# Patient Record
Sex: Female | Born: 1970 | Race: Asian | Hispanic: No | Marital: Married | Smoking: Never smoker
Health system: Southern US, Community
[De-identification: ages and names within clinical notes are randomized; demographics above are authoritative.]

## PROBLEM LIST (undated history)

## (undated) DIAGNOSIS — R0602 Shortness of breath: Secondary | ICD-10-CM

## (undated) DIAGNOSIS — G43909 Migraine, unspecified, not intractable, without status migrainosus: Secondary | ICD-10-CM

## (undated) DIAGNOSIS — E039 Hypothyroidism, unspecified: Secondary | ICD-10-CM

## (undated) DIAGNOSIS — K589 Irritable bowel syndrome without diarrhea: Secondary | ICD-10-CM

## (undated) DIAGNOSIS — K219 Gastro-esophageal reflux disease without esophagitis: Secondary | ICD-10-CM

## (undated) DIAGNOSIS — E079 Disorder of thyroid, unspecified: Secondary | ICD-10-CM

## (undated) DIAGNOSIS — R002 Palpitations: Secondary | ICD-10-CM

## (undated) HISTORY — DX: Disorder of thyroid, unspecified: E07.9

## (undated) HISTORY — DX: Migraine, unspecified, not intractable, without status migrainosus: G43.909

## (undated) HISTORY — DX: Hypothyroidism, unspecified: E03.9

## (undated) HISTORY — PX: ANAL FISSURE REPAIR: SHX2312

## (undated) HISTORY — DX: Shortness of breath: R06.02

## (undated) HISTORY — DX: Palpitations: R00.2

## (undated) HISTORY — DX: Gastro-esophageal reflux disease without esophagitis: K21.9

## (undated) HISTORY — DX: Irritable bowel syndrome, unspecified: K58.9

---

## 2006-04-07 ENCOUNTER — Ambulatory Visit (HOSPITAL_COMMUNITY): Admission: RE | Admit: 2006-04-07 | Discharge: 2006-04-07 | Payer: Self-pay | Admitting: Gastroenterology

## 2006-04-09 ENCOUNTER — Ambulatory Visit (HOSPITAL_COMMUNITY): Admission: RE | Admit: 2006-04-09 | Discharge: 2006-04-09 | Payer: Self-pay | Admitting: Gastroenterology

## 2006-08-20 ENCOUNTER — Encounter: Admission: RE | Admit: 2006-08-20 | Discharge: 2006-08-20 | Payer: Self-pay | Admitting: Gastroenterology

## 2008-08-03 ENCOUNTER — Other Ambulatory Visit: Admission: RE | Admit: 2008-08-03 | Discharge: 2008-08-03 | Payer: Self-pay | Admitting: Obstetrics & Gynecology

## 2009-04-18 ENCOUNTER — Encounter: Admission: RE | Admit: 2009-04-18 | Discharge: 2009-04-18 | Payer: Self-pay | Admitting: Family Medicine

## 2009-07-13 ENCOUNTER — Encounter: Admission: RE | Admit: 2009-07-13 | Discharge: 2009-07-13 | Payer: Self-pay | Admitting: Family Medicine

## 2010-04-09 ENCOUNTER — Emergency Department (HOSPITAL_BASED_OUTPATIENT_CLINIC_OR_DEPARTMENT_OTHER): Admission: EM | Admit: 2010-04-09 | Discharge: 2010-04-09 | Payer: Self-pay | Admitting: Emergency Medicine

## 2010-04-09 ENCOUNTER — Ambulatory Visit: Payer: Self-pay | Admitting: Diagnostic Radiology

## 2010-10-25 ENCOUNTER — Ambulatory Visit: Payer: Self-pay | Admitting: Cardiology

## 2010-10-25 ENCOUNTER — Encounter: Payer: Self-pay | Admitting: Cardiology

## 2010-11-08 ENCOUNTER — Ambulatory Visit: Payer: Self-pay

## 2012-12-01 ENCOUNTER — Encounter: Payer: Self-pay | Admitting: *Deleted

## 2012-12-16 ENCOUNTER — Other Ambulatory Visit: Payer: Self-pay | Admitting: Gastroenterology

## 2012-12-16 DIAGNOSIS — R110 Nausea: Secondary | ICD-10-CM

## 2012-12-16 DIAGNOSIS — R1033 Periumbilical pain: Secondary | ICD-10-CM

## 2013-02-01 ENCOUNTER — Ambulatory Visit (INDEPENDENT_AMBULATORY_CARE_PROVIDER_SITE_OTHER): Payer: BC Managed Care – PPO | Admitting: Obstetrics & Gynecology

## 2013-02-01 ENCOUNTER — Encounter: Payer: Self-pay | Admitting: Obstetrics & Gynecology

## 2013-02-01 ENCOUNTER — Other Ambulatory Visit: Payer: Self-pay | Admitting: Obstetrics & Gynecology

## 2013-02-01 VITALS — BP 94/60 | Ht 66.5 in | Wt 111.8 lb

## 2013-02-01 DIAGNOSIS — L819 Disorder of pigmentation, unspecified: Secondary | ICD-10-CM

## 2013-02-01 DIAGNOSIS — Z1231 Encounter for screening mammogram for malignant neoplasm of breast: Secondary | ICD-10-CM

## 2013-02-01 DIAGNOSIS — R140 Abdominal distension (gaseous): Secondary | ICD-10-CM | POA: Insufficient documentation

## 2013-02-01 DIAGNOSIS — Z01419 Encounter for gynecological examination (general) (routine) without abnormal findings: Secondary | ICD-10-CM | POA: Insufficient documentation

## 2013-02-01 DIAGNOSIS — R141 Gas pain: Secondary | ICD-10-CM

## 2013-02-01 NOTE — Progress Notes (Signed)
42 y.o. G2P2 Married [place] F here for annual exam.  She is doing really well.  Has two girls--13 and 16.  Stills reports having "stomach issues" that seem cycle related.  She does have some anxiety about this.  Had GI evaluation--next step would be a HIDA scan.  Mrs. Chloe Santos doesn't want to proceed with any of this.  She did not try the birth control pills last year.  She now worries about ovarian cancer.  Biggest symptom is bloating.  Next week she is seeing Herb Grays, MD, for physical exam.  Patient's last menstrual period was 01/28/2013.          Sexually active: yes  The current method of family planning is condoms.    Exercising: yes  Home exercise routine includes eliptical off/on.. Smoker:  no  Health Maintenance: Pap:  01/01/12 WNL/negative HR HPV MMG:  07/13/09 Colonoscopy:  None  Endoscopy 2004 BMD:   None  TDaP:  2008 Labs:  Will do with Dr. Collins Scotland   reports that she has never smoked. She does not have any smokeless tobacco history on file. She reports that  drinks alcohol. She reports that she does not use illicit drugs.  Past Medical History  Diagnosis Date  . Thyroid disease     hypothyroidism  . Migraine   . GERD (gastroesophageal reflux disease)   . IBS (irritable bowel syndrome)     Past Surgical History  Procedure Laterality Date  . Cesarean section      x 2    Current Outpatient Prescriptions  Medication Sig Dispense Refill  . acetaminophen (TYLENOL) 325 MG tablet Take 650 mg by mouth every 6 (six) hours as needed for pain.      . Cetirizine HCl (ZYRTEC PO) Take by mouth as needed.      Marland Kitchen Dexlansoprazole (DEXILANT PO) Take by mouth daily.      Marland Kitchen levothyroxine (SYNTHROID, LEVOTHROID) 100 MCG tablet Take 100 mcg by mouth daily.      . Multiple Vitamins-Minerals (MULTIVITAMIN PO) Take by mouth daily.      . Probiotic Product (PROBIOTIC DAILY PO) Take by mouth daily.       No current facility-administered medications for this visit.    No family history on  file.  ROS:  Pertinent items are noted in HPI.  Otherwise, a comprehensive ROS was negative.  Exam:   BP 94/60  Ht 5' 6.5" (1.689 m)  Wt 111 lb 12.8 oz (50.712 kg)  BMI 17.78 kg/m2  LMP 01/28/2013 Height:   Height: 5' 6.5" (168.9 cm)  Ht Readings from Last 3 Encounters:  02/01/13 5' 6.5" (1.689 m)    General appearance: alert, cooperative and appears stated age Head: Normocephalic, without obvious abnormality, atraumatic Neck: no adenopathy, supple, symmetrical, trachea midline and thyroid not enlarged, symmetric, no tenderness/mass/nodules Lungs: clear to auscultation bilaterally Breasts: Inspection negative, No nipple retraction or dimpling, No nipple discharge or bleeding, No axillary or supraclavicular adenopathy, Normal to palpation without dominant masses Heart: regular rate and rhythm Abdomen: soft, non-tender; bowel sounds normal; no masses,  no organomegaly Extremities: extremities normal, atraumatic, no cyanosis or edema Skin: Skin color, texture, turgor normal. No rashes or lesions Lymph nodes: Cervical, supraclavicular, and axillary nodes normal. No abnormal inguinal nodes palpated Neurologic: Grossly normal   Pelvic: External genitalia:  no lesions              Urethra:  normal appearing urethra with no masses, tenderness or lesions  Bartholins and Skenes: normal                 Vagina: normal appearing vagina with normal color and discharge, no lesions              Cervix: no lesions              Pap taken: no Bimanual Exam:  Uterus:  normal size, contour, position, consistency, mobility, non-tender              Adnexa: no mass, fullness, tenderness               Rectovaginal: Confirms               Anus:  normal sphincter tone, no lesions  A:  Well Woman with normal exam Hypothyroidism Pigmented lesions on skin Blaoting, history IBS  P:   Mammogram--knows due!!!!  Appt made for 03/10/13 at 10:20am Dermatology referral.  Rica Mote is best for pt. Labs  with Dr. Collins Scotland Return for PUS to check ovaries.  Tues afternoon is good for pt. return annually or prn  An After Visit Summary was printed and given to the patient.

## 2013-02-01 NOTE — Patient Instructions (Addendum)

## 2013-02-03 ENCOUNTER — Telehealth: Payer: Self-pay | Admitting: Obstetrics & Gynecology

## 2013-02-03 NOTE — Telephone Encounter (Signed)
LVM w/ pt to advise PUS copay $20.  Awaiting call back to schedule.

## 2013-02-07 ENCOUNTER — Other Ambulatory Visit: Payer: Self-pay | Admitting: Obstetrics & Gynecology

## 2013-02-07 NOTE — Telephone Encounter (Signed)
Sent rx without any refills, called pharmacy and ok'ed #90/3 RF.

## 2013-02-07 NOTE — Telephone Encounter (Signed)
Pt returning your call. Pt says 2:30 tomorrow is good for ultrasound appt.

## 2013-02-08 ENCOUNTER — Ambulatory Visit (INDEPENDENT_AMBULATORY_CARE_PROVIDER_SITE_OTHER): Payer: BC Managed Care – PPO | Admitting: Obstetrics & Gynecology

## 2013-02-08 ENCOUNTER — Encounter: Payer: Self-pay | Admitting: Obstetrics & Gynecology

## 2013-02-08 ENCOUNTER — Ambulatory Visit (INDEPENDENT_AMBULATORY_CARE_PROVIDER_SITE_OTHER): Payer: BC Managed Care – PPO

## 2013-02-08 DIAGNOSIS — R143 Flatulence: Secondary | ICD-10-CM

## 2013-02-08 DIAGNOSIS — R141 Gas pain: Secondary | ICD-10-CM

## 2013-02-08 DIAGNOSIS — R140 Abdominal distension (gaseous): Secondary | ICD-10-CM

## 2013-02-08 DIAGNOSIS — N852 Hypertrophy of uterus: Secondary | ICD-10-CM

## 2013-02-08 DIAGNOSIS — N854 Malposition of uterus: Secondary | ICD-10-CM

## 2013-02-08 DIAGNOSIS — R109 Unspecified abdominal pain: Secondary | ICD-10-CM

## 2013-02-08 DIAGNOSIS — N8300 Follicular cyst of ovary, unspecified side: Secondary | ICD-10-CM

## 2013-02-08 NOTE — Progress Notes (Signed)
42 y.o. G2P2 Married [place] F here for pelvic ultrasound due to continued abdominal pain that does seem to worsen around her cycle.  She has seen Dr. Loreta Ave, GI, and has undergone several tests.  She has been told that she should consider a cholecystectomy but is really not interested in that.  She has been diagnosed with IBS but is frustrated that so many of her tests are negative.  She does understand that this is basically a diagnosis of exclusion.  She may want a second opinion.  At her annual exam, she also expressed concerns over ovarian cancer as one of her symptoms is bloating.  Her exam was completely normal.  With her anxiety over the symptoms and all normal testing, she and I discussed proceeding with a PUS for further evaluation.  She is here for this today.    PUS findings: Normal uterus without findings measuring:  8.9 x 5.3 x 4.1cm Endometrium 9.22mm, trilaminar Right ovary 2.7 x 2.5 x 2.1cm and containing a 1.8cm simple cyst vs follicle Left ovary 2.1 x 1.3 x 1.6cm and appears normal Posterior cul de sac negative for free fluid  Images shown to patient and results discussed.    Assessment:  Abdominal discomfort and bloating that seems menstrual cycle related Plan:  Declines trial of OCPs.  Will f/u with GI for possible endoscopy.  Will let me know if I can help with this in anyway.  ~15 minutes spent with patient >50% of time was in face to face discussion of above.

## 2013-02-08 NOTE — Patient Instructions (Addendum)
Please call if you have any worsening symptoms or if you need a referral for second opinion.

## 2013-03-10 ENCOUNTER — Ambulatory Visit
Admission: RE | Admit: 2013-03-10 | Discharge: 2013-03-10 | Disposition: A | Discharge status: Home / Self Care | Payer: BC Managed Care – PPO | Source: Ambulatory Visit | Attending: Obstetrics & Gynecology | Admitting: Obstetrics & Gynecology

## 2013-03-10 ENCOUNTER — Encounter: Payer: Self-pay | Admitting: *Deleted

## 2013-03-10 DIAGNOSIS — Z1231 Encounter for screening mammogram for malignant neoplasm of breast: Secondary | ICD-10-CM

## 2013-03-14 ENCOUNTER — Other Ambulatory Visit: Payer: Self-pay | Admitting: Obstetrics & Gynecology

## 2013-03-14 DIAGNOSIS — R928 Other abnormal and inconclusive findings on diagnostic imaging of breast: Secondary | ICD-10-CM

## 2013-03-15 ENCOUNTER — Other Ambulatory Visit (HOSPITAL_COMMUNITY): Payer: Self-pay | Admitting: Cardiovascular Disease

## 2013-03-15 DIAGNOSIS — R0602 Shortness of breath: Secondary | ICD-10-CM

## 2013-03-24 ENCOUNTER — Ambulatory Visit
Admission: RE | Admit: 2013-03-24 | Discharge: 2013-03-24 | Disposition: A | Discharge status: Home / Self Care | Payer: BC Managed Care – PPO | Source: Ambulatory Visit | Attending: Obstetrics & Gynecology | Admitting: Obstetrics & Gynecology

## 2013-03-24 ENCOUNTER — Other Ambulatory Visit: Payer: Self-pay | Admitting: Obstetrics & Gynecology

## 2013-03-24 DIAGNOSIS — R928 Other abnormal and inconclusive findings on diagnostic imaging of breast: Secondary | ICD-10-CM

## 2013-03-24 DIAGNOSIS — N6320 Unspecified lump in the left breast, unspecified quadrant: Secondary | ICD-10-CM

## 2013-03-30 ENCOUNTER — Ambulatory Visit (HOSPITAL_COMMUNITY)
Admission: RE | Admit: 2013-03-30 | Discharge: 2013-03-30 | Disposition: A | Discharge status: Home / Self Care | Payer: BC Managed Care – PPO | Source: Ambulatory Visit | Attending: Cardiovascular Disease | Admitting: Cardiovascular Disease

## 2013-03-30 ENCOUNTER — Encounter (HOSPITAL_COMMUNITY): Payer: BC Managed Care – PPO

## 2013-03-30 DIAGNOSIS — R0609 Other forms of dyspnea: Secondary | ICD-10-CM | POA: Insufficient documentation

## 2013-03-30 DIAGNOSIS — R0989 Other specified symptoms and signs involving the circulatory and respiratory systems: Secondary | ICD-10-CM | POA: Insufficient documentation

## 2013-03-30 DIAGNOSIS — R0602 Shortness of breath: Secondary | ICD-10-CM

## 2013-03-30 DIAGNOSIS — I079 Rheumatic tricuspid valve disease, unspecified: Secondary | ICD-10-CM | POA: Insufficient documentation

## 2013-03-30 DIAGNOSIS — I059 Rheumatic mitral valve disease, unspecified: Secondary | ICD-10-CM | POA: Insufficient documentation

## 2013-03-30 NOTE — Progress Notes (Signed)
2D Echo Performed 03/30/2013    Susi Goslin, RCS  

## 2013-04-01 ENCOUNTER — Telehealth: Payer: Self-pay | Admitting: *Deleted

## 2013-04-01 DIAGNOSIS — R079 Chest pain, unspecified: Secondary | ICD-10-CM

## 2013-04-01 NOTE — Telephone Encounter (Signed)
Patient was orginally scheduled for a myoview, but insurance denied the [place].  Dr Allyson Sabal wants patient to proceed with a met test. Patient agreeable and ready to schedule test

## 2013-04-06 ENCOUNTER — Ambulatory Visit
Admission: RE | Admit: 2013-04-06 | Discharge: 2013-04-06 | Disposition: A | Discharge status: Home / Self Care | Payer: BC Managed Care – PPO | Source: Ambulatory Visit | Attending: Obstetrics & Gynecology | Admitting: Obstetrics & Gynecology

## 2013-04-06 ENCOUNTER — Other Ambulatory Visit: Payer: Self-pay | Admitting: Obstetrics & Gynecology

## 2013-04-06 ENCOUNTER — Other Ambulatory Visit: Payer: BC Managed Care – PPO

## 2013-04-06 DIAGNOSIS — N6320 Unspecified lump in the left breast, unspecified quadrant: Secondary | ICD-10-CM

## 2013-04-11 ENCOUNTER — Ambulatory Visit: Payer: BC Managed Care – PPO | Admitting: Cardiovascular Disease

## 2013-05-06 ENCOUNTER — Ambulatory Visit: Payer: BC Managed Care – PPO | Admitting: Cardiovascular Disease

## 2013-05-23 ENCOUNTER — Encounter (INDEPENDENT_AMBULATORY_CARE_PROVIDER_SITE_OTHER): Payer: BC Managed Care – PPO

## 2013-05-23 DIAGNOSIS — R079 Chest pain, unspecified: Secondary | ICD-10-CM

## 2013-05-23 DIAGNOSIS — R0602 Shortness of breath: Secondary | ICD-10-CM

## 2013-05-30 ENCOUNTER — Ambulatory Visit: Payer: BC Managed Care – PPO | Admitting: Cardiovascular Disease

## 2013-06-02 ENCOUNTER — Encounter: Payer: Self-pay | Admitting: Cardiovascular Disease

## 2013-06-02 ENCOUNTER — Ambulatory Visit (INDEPENDENT_AMBULATORY_CARE_PROVIDER_SITE_OTHER): Payer: BC Managed Care – PPO | Admitting: Cardiovascular Disease

## 2013-06-02 ENCOUNTER — Encounter: Payer: Self-pay | Admitting: Cardiology

## 2013-06-02 ENCOUNTER — Telehealth: Payer: Self-pay | Admitting: Internal Medicine

## 2013-06-02 VITALS — BP 96/62 | HR 76 | Ht 67.0 in | Wt 114.8 lb

## 2013-06-02 DIAGNOSIS — R0602 Shortness of breath: Secondary | ICD-10-CM

## 2013-06-02 DIAGNOSIS — K219 Gastro-esophageal reflux disease without esophagitis: Secondary | ICD-10-CM

## 2013-06-02 NOTE — Patient Instructions (Signed)
You have been referred to Chloe Santos at [facility]  Your physician recommends that you schedule a follow-up appointment in: 3 months

## 2013-06-02 NOTE — Assessment & Plan Note (Signed)
2-D echo is entirely normal. An EP test showed a low.V O2and a low FEV1 and diffusion capacity. I I'm going refer  her to Dr. Marchelle Gearing for pulmonary evaluation. It certainly possible she also has a metabolic/mitochondrial abnormality

## 2013-06-02 NOTE — Progress Notes (Signed)
06/02/2013 Chloe Santos   1971/06/02  [id number]  Primary Physician MILLER, Marda Stalker, MD Primary Cardiologist: Runell Gess MD Roseanne Reno   HPI:  The patient is a very pleasant 42 year old thin and fit-appearing married [place] female, mother of 2 teenagers, who works at home, and was referred by Dr. Collins Scotland for cardiovascular evaluation because of progressive dyspnea on exertion. She basically has no cardiac risk factors. She was diagnosed with GERD 4 years ago and placed on Dexilant because of chest pressure, which ultimately resolved. Over the last year or so, she has noticed progressive dyspnea to the point where she is scared to exercise.since I saw her approximately 2 months ago her symptoms have persisted. A 2-D echo was normal and an EP test revealed decreased VO2, FEV1 and diffusion capacity.    Current Outpatient Prescriptions  Medication Sig Dispense Refill  . acetaminophen (TYLENOL) 325 MG tablet Take 650 mg by mouth every 6 (six) hours as needed for pain.      Marland Kitchen CALCIUM PO Take by mouth.      . cetirizine (ZYRTEC) 5 MG tablet Take 5 mg by mouth daily as needed.       Marland Kitchen Dexlansoprazole (DEXILANT PO) Take 30 mg by mouth daily.      Donald Prose (FLORA-Q) CAPS Take by mouth as needed.      Marland Kitchen levothyroxine (SYNTHROID, LEVOTHROID) 100 MCG tablet Take 100 mcg by mouth daily.      . Multiple Vitamin (MULTIVITAMIN) tablet Take 1 tablet by mouth daily.       No current facility-administered medications for this visit.    Allergies  Allergen Reactions  . Hyoscyamine Sulfate     History   Social History  . Marital Status: Married    Spouse Name: N/A    Number of Children: 2  . Years of Education: N/A   Occupational History  . Not on file.   Social History Main Topics  . Smoking status: Never Smoker   . Smokeless tobacco: Not on file  . Alcohol Use: Yes     Comment: occassionally  . Drug Use: No  . Sexually Active: Yes -- Female partner(s)    Birth  Control/ Protection: Condom   Other Topics Concern  . Not on file   Social History Narrative   ** Merged History Encounter **         Review of Systems: General: negative for chills, fever, night sweats or weight changes.  Cardiovascular: negative for chest pain, dyspnea on exertion, edema, orthopnea, palpitations, paroxysmal nocturnal dyspnea or shortness of breath Dermatological: negative for rash Respiratory: negative for cough or wheezing Urologic: negative for hematuria Abdominal: negative for nausea, vomiting, diarrhea, bright red blood per rectum, melena, or hematemesis Neurologic: negative for visual changes, syncope, or dizziness All other systems reviewed and are otherwise negative except as noted above.    Blood pressure 96/62, pulse 76, height 5\' 7"  (1.702 m), weight 114 lb 12.8 oz (52.073 kg).  General appearance: alert and no distress Neck: no adenopathy, no carotid bruit, no JVD, supple, symmetrical, trachea midline and thyroid not enlarged, symmetric, no tenderness/mass/nodules Lungs: clear to auscultation bilaterally Heart: regular rate and rhythm, S1, S2 normal, no murmur, click, rub or gallop Extremities: extremities normal, atraumatic, no cyanosis or edema  EKG not performed today  ASSESSMENT AND PLAN:   Shortness of breath 2-D echo is entirely normal. An EP test showed a low.V O2and a low FEV1 and diffusion capacity. I I'm going refer  her to Dr. Marchelle Gearing for pulmonary evaluation. It certainly possible she also has a metabolic/mitochondrial abnormality      Runell Gess MD [facility] 06/02/2013 3:06 PM

## 2013-06-02 NOTE — Telephone Encounter (Signed)
Dr Allyson Sabal called. Air hunger. Cardiac workup normal.   Low vo2 58% fev1 78%  Dr Allyson Sabal wants me to see her as new consult. Can you guyus get her in in 4 weeks?   Dr. Kalman Shan, M.D., [facility].C.P Pulmonary and Critical Care Medicine Staff Physician [facility] [place] Pulmonary and Critical Care Pager: [phone], If no answer or between  15:00h - 7:00h: call [phone]  06/02/2013 3:43 PM

## 2013-06-03 NOTE — Telephone Encounter (Signed)
Pt advised and appt set for 07-19-13. Carron Curie, CMA

## 2013-06-22 ENCOUNTER — Ambulatory Visit (INDEPENDENT_AMBULATORY_CARE_PROVIDER_SITE_OTHER): Payer: BC Managed Care – PPO | Admitting: Internal Medicine

## 2013-06-22 ENCOUNTER — Encounter: Payer: Self-pay | Admitting: Internal Medicine

## 2013-06-22 VITALS — BP 98/70 | HR 69 | Temp 98.0°F | Ht 67.0 in | Wt 110.0 lb

## 2013-06-22 DIAGNOSIS — R0609 Other forms of dyspnea: Secondary | ICD-10-CM

## 2013-06-22 DIAGNOSIS — R0602 Shortness of breath: Secondary | ICD-10-CM

## 2013-06-22 DIAGNOSIS — R0689 Other abnormalities of breathing: Secondary | ICD-10-CM

## 2013-06-22 NOTE — Patient Instructions (Addendum)
Unclear what is causing your problems Repeat Breathing test called PFT in our clinic or our hospital(s) Have CT chest Will call with results to decide next step

## 2013-06-22 NOTE — Progress Notes (Signed)
n Subjective:    Patient ID: Chloe Santos, female    DOB: 1971/08/14, 42 y.o.   MRN: [id number]  PCP Herb Grays, MD Cadiologist: Dr Nanetta Batty GI:   HPI  IOV 06/22/2013  42 year old female. Immigrant from [place]  Reports dyspnea x 5 years. Insidious onset. Prior to onste was exercsing.  Made worse with exertion and bending; bending was worst especially at onset. INitially PMD said is GERD. PMD ordered Barium Swallow 4-5 years ago and was told she had "bad acid reflux". SInce then on PPI.  Periodically gets samples of dexilant from Dr Loreta Ave . Patient says dexilant works best but only for "acidity" which she means as heavy chest sensation. However, PPI has not helped dyspnea or bendopnea. She still has dyspnea with bending and exertion.   With bending: even now there is a mixture of GI burping and dyspnea that is worse after meals.   With exertion there is clear cut dyspnea on exertion. Now does not exercise anymore. Says even biking from garage to 1 block or even 200 feet or even 5 min on treadmill will make her very dyspneic but no chest tigthness or pain. . PEr her  32 year old daughter can do better than her. She says that as a teen she used to play basketball and so this is a big change. Symptoms are slowly and steadily progressive.  She thinks symptoms are some  improved by albuterol which her daughter ruses for asthma; tried it only 3 times in past few yearso  She used to have PND but after PPI in past few year she does not have PND. Denies orthopnea, edema, cough, hemoptysis, sputum, chest pain  She does have occ spring sinus allergies for which she takes zyrtec prn  Due to above saw DR Allyson Sabal : echo reportedly normal   She does have low BMI Body mass index is 17.22 kg/(m^2).  - she is on synthroid  - she has not lost weight but unable to gain weight  - weight stable x years  GYN   - normal menses   Famil y hx   - both daughters 22, 40 have seasonal asthma   PFT  05/26/13  - Restriction with low DLCO, FVC 70s and DLCO 67% - stad "Asian" and "Crapo" but these could well be normal for a Nepali femalem    06/22/2013  - 185 feet x 3 laps: NO DESATURATION. Lowest   CPST done at [facility] and 2nd opinion fro Laymond Purser  - done with Left ankle boot  I looked at this test. I think that it was probably a good test..the workload increments (in hindsight) were probably too big. I very much dislike the automated interpretation site they use, I think they tend to give very broad interpretations that many times are not very helpful. I don't see a cardiovascular or pulmonary limitation to the exercise. The normal Ve/VCO2 slope and normal peak RER indicate that it is not mitochondrial.     She has a mild restrictive pattern in PFT at rest, but her ventilatory response to exercise was really kind of low (Resp rate was only 29.typically expect 35-45 bpm, Vt/IC was 56%...typically expect 60-80%). They don't provide PETCO2, which could be helpful for matching ventilation..though I would imagine that it is normal.   I am not sure that you would get much more out of repeating the test.   Renae Fickle   Past Medical History  Diagnosis Date  . Chest  pain   . Palpitations   . SOB (shortness of breath) on exertion   . Hypothyroidism   . Acid reflux   . Thyroid disease   . Migraines   . Thyroid disease     hypothyroidism  . Migraine   . GERD (gastroesophageal reflux disease)   . IBS (irritable bowel syndrome)      Family History  Problem Relation Age of Onset  . Diabetes Father   . Hypertension Father      History   Social History  . Marital Status: Married    Spouse Name: N/A    Number of Children: 2  . Years of Education: N/A   Occupational History  . Not on file.   Social History Main Topics  . Smoking status: Never Smoker   . Smokeless tobacco: Not on file  . Alcohol Use: Yes     Comment: occassionally  . Drug Use: No  . Sexual Activity: Yes    Partners:  Male    Birth Control/ Protection: Condom   Other Topics Concern  . Not on file   Social History Narrative   ** Merged History Encounter **         Allergies  Allergen Reactions  . Hyoscyamine Sulfate      Outpatient Prescriptions Prior to Visit  Medication Sig Dispense Refill  . acetaminophen (TYLENOL) 325 MG tablet Take 650 mg by mouth every 6 (six) hours as needed for pain.      . cetirizine (ZYRTEC) 5 MG tablet Take 5 mg by mouth daily as needed.       Marland Kitchen Dexlansoprazole (DEXILANT PO) Take 30 mg by mouth daily.      Donald Prose (FLORA-Q) CAPS Take by mouth as needed.      Marland Kitchen levothyroxine (SYNTHROID, LEVOTHROID) 100 MCG tablet Take 100 mcg by mouth daily.      . Multiple Vitamin (MULTIVITAMIN) tablet Take 1 tablet by mouth daily.      Marland Kitchen CALCIUM PO Take by mouth.       No facility-administered medications prior to visit.      Review of Systems  Constitutional: Negative for fever and unexpected weight change.  HENT: Negative for ear pain, nosebleeds, congestion, sore throat, rhinorrhea, sneezing, trouble swallowing, dental problem, postnasal drip and sinus pressure.   Eyes: Negative for redness and itching.  Respiratory: Positive for shortness of breath. Negative for cough, chest tightness and wheezing.   Cardiovascular: Negative for palpitations and leg swelling.  Gastrointestinal: Negative for nausea and vomiting.  Genitourinary: Negative for dysuria.  Musculoskeletal: Negative for joint swelling.  Skin: Negative for rash.  Neurological: Negative for headaches.  Hematological: Does not bruise/bleed easily.  Psychiatric/Behavioral: Negative for dysphoric mood. The patient is not nervous/anxious.        Objective:   Physical Exam  Vitals reviewed. Constitutional: She is oriented to person, place, and time. She appears well-developed and well-nourished. No distress.  HENT:  Head: Normocephalic and atraumatic.  Right Ear: External ear normal.  Left Ear: External ear  normal.  Mouth/Throat: Oropharynx is clear and moist. No oropharyngeal exudate.  Eyes: Conjunctivae and EOM are normal. Pupils are equal, round, and reactive to light. Right eye exhibits no discharge. Left eye exhibits no discharge. No scleral icterus.  Neck: Normal range of motion. Neck supple. No JVD present. No tracheal deviation present. No thyromegaly present.  Cardiovascular: Normal rate, regular rhythm, normal heart sounds and intact distal pulses.  Exam reveals no gallop and no friction rub.  No murmur heard. Pulmonary/Chest: Effort normal and breath sounds normal. No respiratory distress. She has no wheezes. She has no rales. She exhibits no tenderness.  Abdominal: Soft. Bowel sounds are normal. She exhibits no distension and no mass. There is no tenderness. There is no rebound and no guarding.  Musculoskeletal: Normal range of motion. She exhibits no edema and no tenderness.  Left ankle strain and is in boot  Lymphadenopathy:    She has no cervical adenopathy.  Neurological: She is alert and oriented to person, place, and time. She has normal reflexes. No cranial nerve deficit. She exhibits normal muscle tone. Coordination normal.  Skin: Skin is warm and dry. No rash noted. She is not diaphoretic. No erythema. No pallor.  Psychiatric: She has a normal mood and affect. Her behavior is normal. Judgment and thought content normal.    Filed Vitals:   06/22/13 1636  BP: 98/70  Pulse: 69  Temp: 98 F (36.7 C)  TempSrc: Oral  Height: 5\' 7"  (1.702 m)  Weight: 110 lb (49.896 kg)  SpO2: 100%   Body mass index is 17.22 kg/(m^2). Estimated body mass index is 17.22 kg/(m^2) as calculated from the following:   Height as of this encounter: 5\' 7"  (1.702 m).   Weight as of this encounter: 110 lb (49.896 kg).       Assessment & Plan:

## 2013-06-24 ENCOUNTER — Ambulatory Visit (INDEPENDENT_AMBULATORY_CARE_PROVIDER_SITE_OTHER)
Admission: RE | Admit: 2013-06-24 | Discharge: 2013-06-24 | Disposition: A | Discharge status: Home / Self Care | Payer: BC Managed Care – PPO | Source: Ambulatory Visit | Attending: Internal Medicine | Admitting: Internal Medicine

## 2013-06-24 ENCOUNTER — Ambulatory Visit (INDEPENDENT_AMBULATORY_CARE_PROVIDER_SITE_OTHER): Payer: BC Managed Care – PPO | Admitting: Internal Medicine

## 2013-06-24 DIAGNOSIS — R0602 Shortness of breath: Secondary | ICD-10-CM

## 2013-06-24 DIAGNOSIS — R0600 Dyspnea, unspecified: Secondary | ICD-10-CM

## 2013-06-24 DIAGNOSIS — R0609 Other forms of dyspnea: Secondary | ICD-10-CM

## 2013-06-24 LAB — PULMONARY FUNCTION TEST

## 2013-06-24 NOTE — Progress Notes (Signed)
PFT done today. 

## 2013-06-29 ENCOUNTER — Telehealth: Payer: Self-pay | Admitting: Internal Medicine

## 2013-06-29 DIAGNOSIS — R0600 Dyspnea, unspecified: Secondary | ICD-10-CM

## 2013-06-29 NOTE — Telephone Encounter (Signed)
Called patient  Dyspnea not explained by PFT and CT chest; will get methacholine challenge test. Victorino Dike please set this up amd let me know date  My notes: below  She does have restricted PFT with low dlco but this is not being picked up by CPST. ? Is this normal variant in a nepali female versus neuromuscular - might only know if I compare with her daughters or other adult nepali female  Explained to patient. She is in agreement with plan  Dr. Kalman Shan, M.D., [facility].C.P Pulmonary and Critical Care Medicine Staff Physician [facility] Pulmonary and Critical Care Pager: [phone], If no answer or between  15:00h - 7:00h: call [phone]  06/29/2013 4:11 PM

## 2013-06-29 NOTE — Assessment & Plan Note (Signed)
Hard to sourt out . ASthma is on top of list. The restrictiv PFT could be normal vrariant due to ethnicity despite so coalled asian correction. I wil repeat PFT and get CT chest to look at lung parenchyma on account of restrictive pft with low dlco. If CT is ok, will get methacholine challenge test. If methacholine is onormal, will get exhaled NO and do allergy eval (she seems to indicate mdi helps her). If this too is negative, will look at d-dimer or VQ scanor neurmuscular testing  She and husband in agreement with plan

## 2013-06-29 NOTE — Telephone Encounter (Signed)
Order placed and I sent a staff message to [facility] so they will let me know when the appt is for. Carron Curie, CMA

## 2013-07-05 ENCOUNTER — Ambulatory Visit (HOSPITAL_COMMUNITY)
Admission: RE | Admit: 2013-07-05 | Discharge: 2013-07-05 | Disposition: A | Discharge status: Home / Self Care | Payer: BC Managed Care – PPO | Source: Ambulatory Visit | Attending: Internal Medicine | Admitting: Internal Medicine

## 2013-07-05 DIAGNOSIS — R0989 Other specified symptoms and signs involving the circulatory and respiratory systems: Secondary | ICD-10-CM | POA: Insufficient documentation

## 2013-07-05 DIAGNOSIS — R0600 Dyspnea, unspecified: Secondary | ICD-10-CM

## 2013-07-05 DIAGNOSIS — R0609 Other forms of dyspnea: Secondary | ICD-10-CM | POA: Insufficient documentation

## 2013-07-05 LAB — PULMONARY FUNCTION TEST

## 2013-07-05 MED ORDER — ALBUTEROL SULFATE (5 MG/ML) 0.5% IN NEBU
2.5000 mg | INHALATION_SOLUTION | Freq: Once | RESPIRATORY_TRACT | Status: AC
  Administered 2013-07-05: 2.5 mg via RESPIRATORY_TRACT

## 2013-07-05 MED ORDER — METHACHOLINE 16 MG/ML NEB SOLN
2.0000 mL | Freq: Once | RESPIRATORY_TRACT | Status: AC
  Administered 2013-07-05: 32 mg via RESPIRATORY_TRACT

## 2013-07-05 MED ORDER — SODIUM CHLORIDE 0.9 % IN NEBU
3.0000 mL | INHALATION_SOLUTION | Freq: Once | RESPIRATORY_TRACT | Status: AC
  Administered 2013-07-05: 3 mL via RESPIRATORY_TRACT

## 2013-07-05 MED ORDER — METHACHOLINE 4 MG/ML NEB SOLN
2.0000 mL | Freq: Once | RESPIRATORY_TRACT | Status: AC
  Administered 2013-07-05: 8 mg via RESPIRATORY_TRACT

## 2013-07-05 MED ORDER — METHACHOLINE 0.0625 MG/ML NEB SOLN
2.0000 mL | Freq: Once | RESPIRATORY_TRACT | Status: AC
  Administered 2013-07-05: 0.125 mg via RESPIRATORY_TRACT

## 2013-07-05 MED ORDER — METHACHOLINE 1 MG/ML NEB SOLN
2.0000 mL | Freq: Once | RESPIRATORY_TRACT | Status: AC
  Administered 2013-07-05: 2 mg via RESPIRATORY_TRACT

## 2013-07-05 MED ORDER — METHACHOLINE 0.25 MG/ML NEB SOLN
2.0000 mL | Freq: Once | RESPIRATORY_TRACT | Status: AC
  Administered 2013-07-05: 0.5 mg via RESPIRATORY_TRACT

## 2013-07-11 ENCOUNTER — Telehealth: Payer: Self-pay | Admitting: Internal Medicine

## 2013-07-11 DIAGNOSIS — K219 Gastro-esophageal reflux disease without esophagitis: Secondary | ICD-10-CM

## 2013-07-11 NOTE — Telephone Encounter (Signed)
Methacholine challenge  07/05/2013 shows  Baseline FVC of 2.9 L or 71%. FEV1 2.3 L or 72%. Ratio of 99%.  Negative response to methacholine challenge test  I will call her with results   Dr. Kalman Shan, M.D., [facility].C.P Pulmonary and Critical Care Medicine Staff Physician [facility] [place] Pulmonary and Critical Care Pager: [phone], If no answer or between  15:00h - 7:00h: call [phone]  07/11/2013 2:33 PM

## 2013-07-15 NOTE — Telephone Encounter (Signed)
Spoke to patuiebt 7:27 PM on 07/15/2013 She is describing GERD symptoms In 2010 - barium swallow showed GERD Patient also feels GERD could be etiology over allergies  Plan Refer Dr Charna Elizabeth of GI for endoscopy v pH probe study   Dr. Kalman Shan, M.D., [facility].C.P Pulmonary and Critical Care Medicine Staff Physician [facility] [place] Pulmonary and Critical Care Pager: [phone], If no answer or between  15:00h - 7:00h: call [phone]  07/15/2013 7:32 PM

## 2013-07-19 ENCOUNTER — Institutional Professional Consult (permissible substitution): Payer: BC Managed Care – PPO | Admitting: Internal Medicine

## 2013-07-25 ENCOUNTER — Encounter: Payer: Self-pay | Admitting: Internal Medicine

## 2013-09-06 ENCOUNTER — Telehealth: Payer: Self-pay | Admitting: Obstetrics & Gynecology

## 2013-09-06 NOTE — Telephone Encounter (Signed)
Please call pt

## 2013-09-06 NOTE — Telephone Encounter (Signed)
Called patient. She is requesting that Dr. Hyacinth Meeker call her when she is available. She did not want to go into further detail.  She states it is not an emergency. I advised I could send Dr. Hyacinth Meeker a message with her request. Best number to reach her is [phone].

## 2013-09-06 NOTE — Telephone Encounter (Signed)
Patient would like to speak to a nurse Tresa Endo). She said she wanted advice. She said it is about periods but when I asked her if she was having irregular bleeding she said "never mind just forget it" and hung up. Please advise.

## 2013-09-07 NOTE — Telephone Encounter (Signed)
Call to patient for assessment.  Patient states she was actually calling to ask Dr Rondel Baton opinion about her 44 year old daughter.  Daughter has just stared having cycles in June and is having prolonged period this month.  Wants to know if Dr Hyacinth Meeker will see her daughter if GYN needed.  They have PCP appt today. Mother is very anxious after reading on-line, advised that irregular menses very common whne cycles first begin, Daughters bleeding is not heavy and she does not have any dizziness.  Mother states daughter is not sexually active.  Encouraged her to keep PCP appt today and then if GYN recommended, she can call me back for assistance is scheduling.  Advised options may be limited since daughter has only had four cycles.    Advised MD will review call information and if any additional instructions, we will call her back.

## 2013-09-08 NOTE — Telephone Encounter (Signed)
I would be happy to see daughter, just FYI.

## 2013-09-22 ENCOUNTER — Ambulatory Visit: Payer: BC Managed Care – PPO | Attending: Sports Medicine | Admitting: Physical Therapy

## 2013-09-22 DIAGNOSIS — M25579 Pain in unspecified ankle and joints of unspecified foot: Secondary | ICD-10-CM | POA: Insufficient documentation

## 2013-09-22 DIAGNOSIS — IMO0001 Reserved for inherently not codable concepts without codable children: Secondary | ICD-10-CM | POA: Insufficient documentation

## 2013-09-22 DIAGNOSIS — M7989 Other specified soft tissue disorders: Secondary | ICD-10-CM | POA: Insufficient documentation

## 2013-09-22 DIAGNOSIS — M25676 Stiffness of unspecified foot, not elsewhere classified: Secondary | ICD-10-CM | POA: Insufficient documentation

## 2013-09-22 DIAGNOSIS — M25673 Stiffness of unspecified ankle, not elsewhere classified: Secondary | ICD-10-CM | POA: Insufficient documentation

## 2013-09-26 ENCOUNTER — Ambulatory Visit: Payer: BC Managed Care – PPO | Admitting: Physical Therapy

## 2013-09-28 ENCOUNTER — Ambulatory Visit: Payer: BC Managed Care – PPO | Admitting: Physical Therapy

## 2013-09-30 ENCOUNTER — Ambulatory Visit: Payer: BC Managed Care – PPO | Admitting: Physical Therapy

## 2013-10-03 ENCOUNTER — Ambulatory Visit: Payer: BC Managed Care – PPO | Admitting: Physical Therapy

## 2013-10-04 ENCOUNTER — Encounter: Payer: BC Managed Care – PPO | Admitting: Physical Therapy

## 2013-10-10 ENCOUNTER — Ambulatory Visit: Payer: BC Managed Care – PPO | Attending: Sports Medicine | Admitting: Physical Therapy

## 2013-10-10 DIAGNOSIS — M7989 Other specified soft tissue disorders: Secondary | ICD-10-CM | POA: Insufficient documentation

## 2013-10-10 DIAGNOSIS — IMO0001 Reserved for inherently not codable concepts without codable children: Secondary | ICD-10-CM | POA: Insufficient documentation

## 2013-10-10 DIAGNOSIS — M25673 Stiffness of unspecified ankle, not elsewhere classified: Secondary | ICD-10-CM | POA: Insufficient documentation

## 2013-10-10 DIAGNOSIS — M25676 Stiffness of unspecified foot, not elsewhere classified: Secondary | ICD-10-CM | POA: Insufficient documentation

## 2013-10-10 DIAGNOSIS — M25579 Pain in unspecified ankle and joints of unspecified foot: Secondary | ICD-10-CM | POA: Insufficient documentation

## 2013-10-12 ENCOUNTER — Ambulatory Visit: Payer: BC Managed Care – PPO | Admitting: Physical Therapy

## 2013-10-17 ENCOUNTER — Ambulatory Visit: Payer: BC Managed Care – PPO | Admitting: Physical Therapy

## 2013-10-19 ENCOUNTER — Ambulatory Visit: Payer: BC Managed Care – PPO | Admitting: Physical Therapy

## 2013-10-24 ENCOUNTER — Ambulatory Visit: Payer: BC Managed Care – PPO | Admitting: Physical Therapy

## 2013-10-26 ENCOUNTER — Ambulatory Visit: Payer: BC Managed Care – PPO | Admitting: Physical Therapy

## 2013-11-01 ENCOUNTER — Ambulatory Visit: Payer: BC Managed Care – PPO

## 2014-03-07 ENCOUNTER — Telehealth: Payer: Self-pay | Admitting: Obstetrics & Gynecology

## 2014-03-07 NOTE — Telephone Encounter (Signed)
Patient is asking for a refill of levothyroxine (SYNTHROID, LEVOTHROID) 100 MCG tablet  Take 100 mcg by mouth daily. , Last Dose: Not Recorded [facility] [phone]

## 2014-03-08 MED ORDER — LEVOTHYROXINE SODIUM 100 MCG PO TABS: 100.0000 ug | ORAL_TABLET | Freq: Every day | ORAL | Status: DC

## 2014-03-08 NOTE — Telephone Encounter (Signed)
Refill request for LEVOTHYROXINE Last filled by MD on - 02/07/13, #90 X 3 Last AEX - 02/01/13 Next AEX - 04/21/14 RF x 1 sent to pharmacy until AEX.

## 2014-03-22 IMAGING — CT CT CHEST W/O CM
3 of 8 series · 13 of 36 positions shown, 14 images · non-contrast
Comparison: None.

CLINICAL DATA: SOB, restrictive PFTs

CT CHEST WITHOUT CONTRAST
TECHNIQUE: Multidetector CT imaging of the chest was performed
following the standard protocol without IV contrast.

[Series 7: lung · axial · 0.59mm/px · z∈[-207,-22]mm · 7 of 51 slices shown]
[im 7/51  lung]
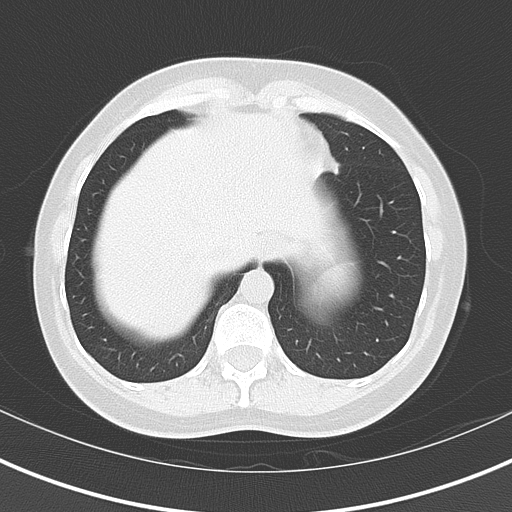
[im 13/51  lung]
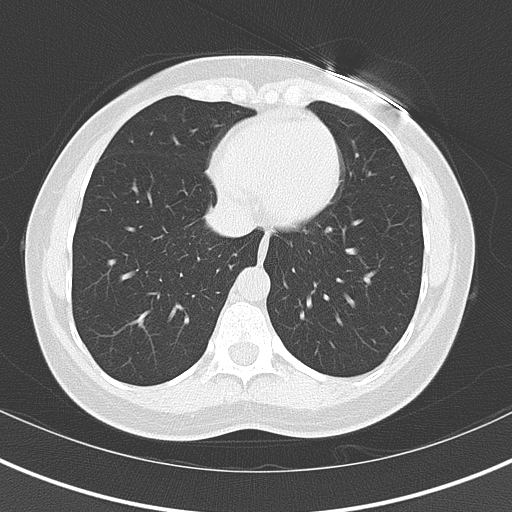
[im 19/51  lung]
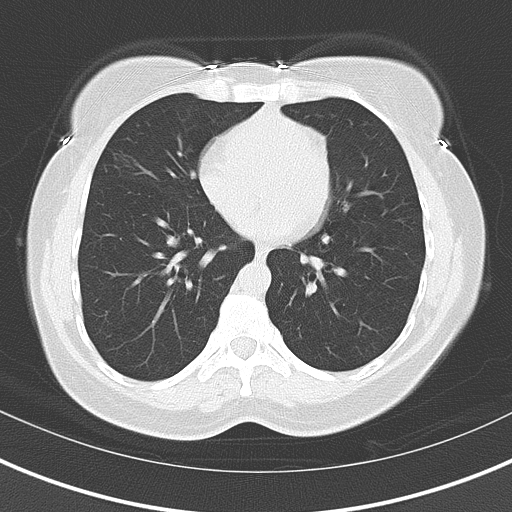
[im 26/51  lung]
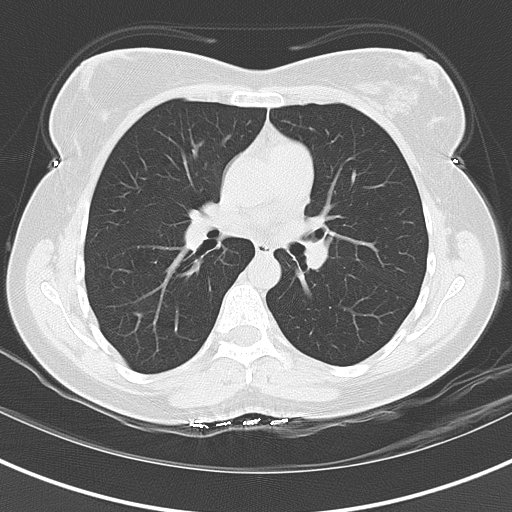
[im 32/51  lung]
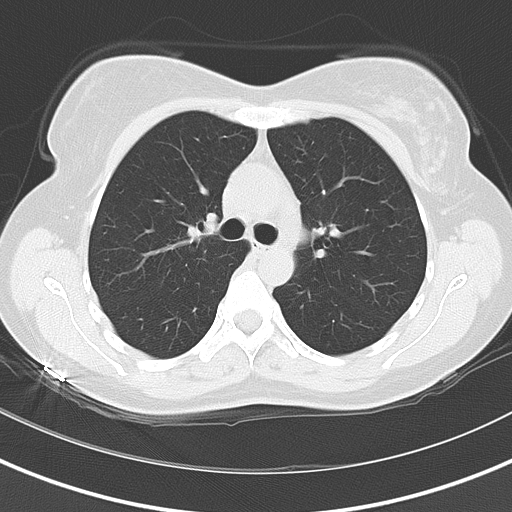
[im 38/51  lung]
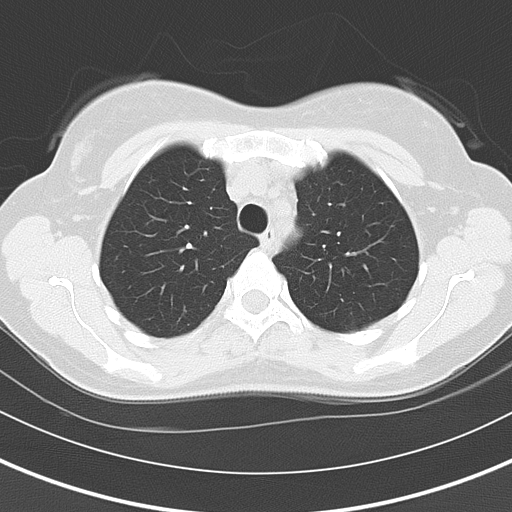
[im 44/51  lung]
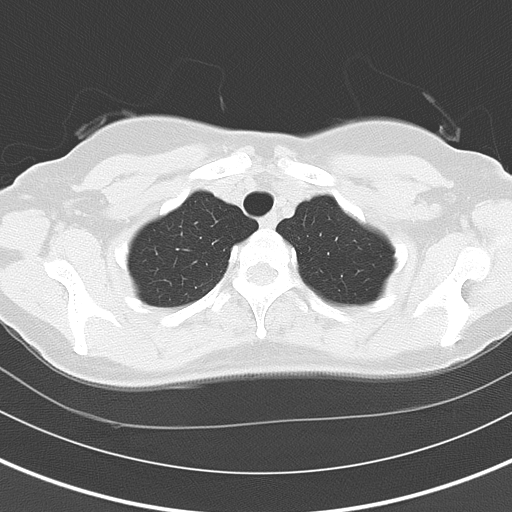

[Series 10: hires retro entire lungs · axial · 0.59mm/px · z∈[-192,-52]mm · 3 of 28 slices shown, 4 images]
[im 7/28  mediastinal]
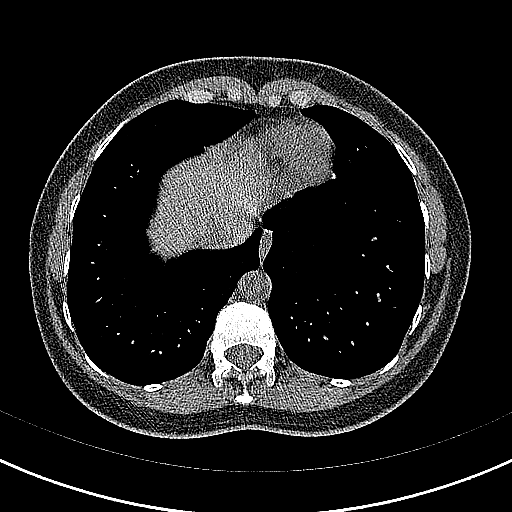
[im 7/28  lung]
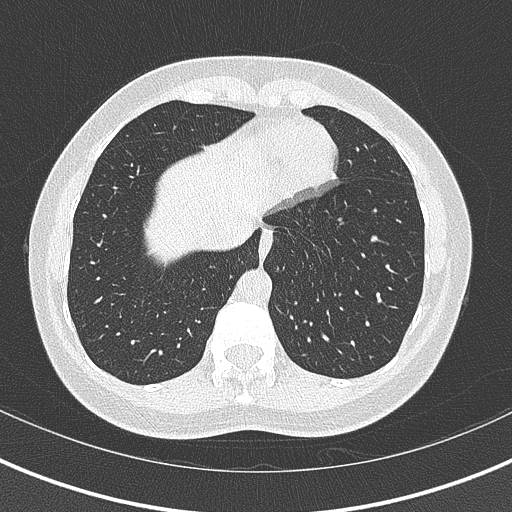
[im 14/28  lung]
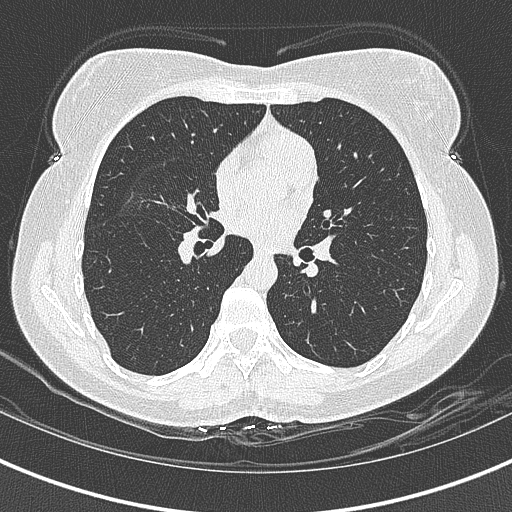
[im 21/28  lung]
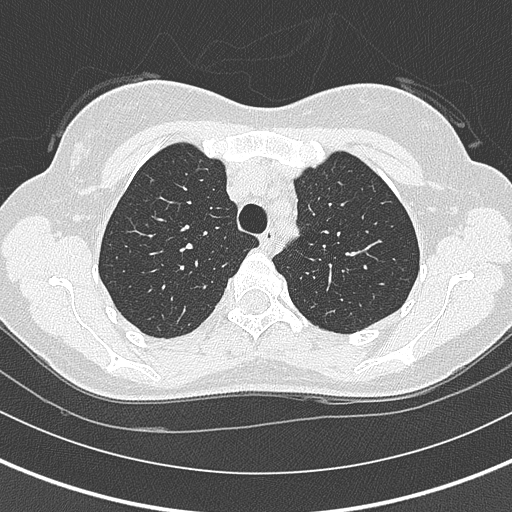

[Series 602: cor · coronal · 0.59mm/px · 3 of 99 slices shown]
[im 20/99  lung]
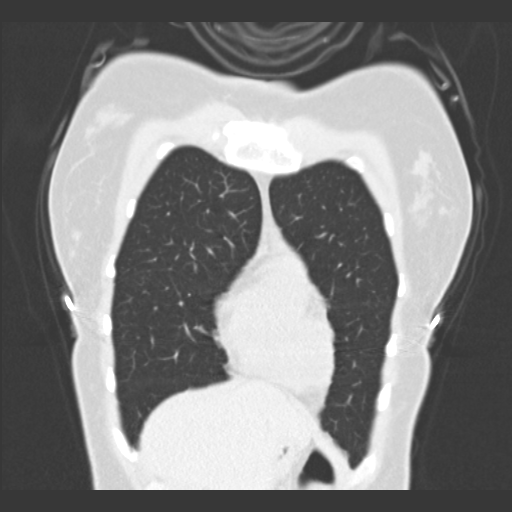
[im 40/99  lung]
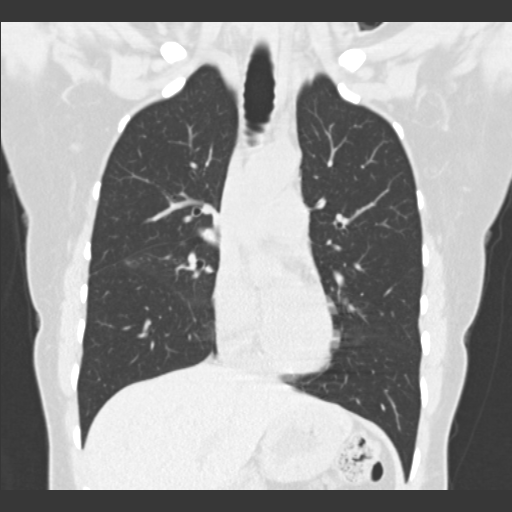
[im 59/99  lung]
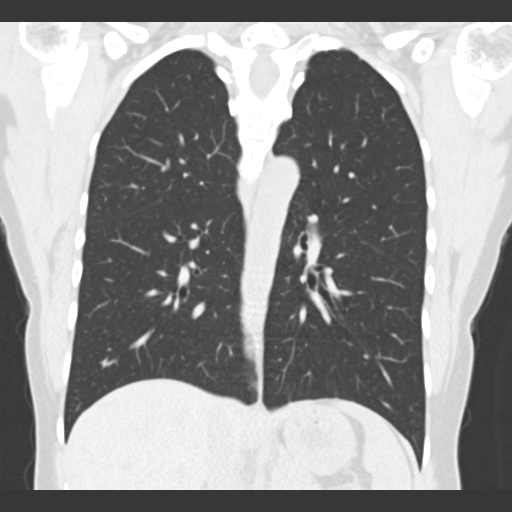

[13 of 36 positions shown; findings below may reference images not displayed]

FINDINGS: Minimal ground-glass opacity / scarring in the right
middle lobe (series 7/image 29).

Lungs are otherwise clear.  No pulmonary nodules.  No focal
consolidation.  No pleural effusion or pneumothorax.

High-resolution inspiratory / expiratory imaging is unremarkable.
Specifically, there is no evidence of mosaic attenuation/air
trapping.

The heart is normal in size.  No pericardial effusion.

Suspected residual thymic tissue in the anterior mediastinum.

Mild asymmetric fibroglandular tissue in the lateral left breast
(series 2/images 18 and 23), corresponding to the region of prior
negative breast biopsy.

Visualized upper abdomen is unremarkable.

Visualized osseous structures are within normal limits.
IMPRESSION: No evidence of acute cardiopulmonary disease.

Minimal ground-glass opacity / scarring in the right middle lobe.

## 2014-04-21 ENCOUNTER — Encounter: Payer: Self-pay | Admitting: Obstetrics & Gynecology

## 2014-04-21 ENCOUNTER — Other Ambulatory Visit: Payer: Self-pay | Admitting: Obstetrics & Gynecology

## 2014-04-21 ENCOUNTER — Ambulatory Visit (INDEPENDENT_AMBULATORY_CARE_PROVIDER_SITE_OTHER): Payer: BC Managed Care – PPO | Admitting: Obstetrics & Gynecology

## 2014-04-21 VITALS — BP 96/58 | HR 64 | Resp 16 | Ht 66.25 in | Wt 113.2 lb

## 2014-04-21 DIAGNOSIS — Z0000 Encounter for general adult medical examination without abnormal findings: Secondary | ICD-10-CM

## 2014-04-21 DIAGNOSIS — Z124 Encounter for screening for malignant neoplasm of cervix: Secondary | ICD-10-CM

## 2014-04-21 DIAGNOSIS — Z01419 Encounter for gynecological examination (general) (routine) without abnormal findings: Secondary | ICD-10-CM

## 2014-04-21 DIAGNOSIS — N63 Unspecified lump in unspecified breast: Secondary | ICD-10-CM

## 2014-04-21 LAB — COMPREHENSIVE METABOLIC PANEL
ALK PHOS: 77 U/L (ref 39–117)
ALT: 28 U/L (ref 0–35)
AST: 25 U/L (ref 0–37)
Albumin: 4.3 g/dL (ref 3.5–5.2)
BILIRUBIN TOTAL: 0.8 mg/dL (ref 0.2–1.2)
BUN: 9 mg/dL (ref 6–23)
CO2: 29 mEq/L (ref 19–32)
Calcium: 9.6 mg/dL (ref 8.4–10.5)
Chloride: 103 mEq/L (ref 96–112)
Creat: 0.67 mg/dL (ref 0.50–1.10)
Glucose, Bld: 74 mg/dL (ref 70–99)
Potassium: 4.6 mEq/L (ref 3.5–5.3)
SODIUM: 139 meq/L (ref 135–145)
TOTAL PROTEIN: 7 g/dL (ref 6.0–8.3)

## 2014-04-21 LAB — LIPID PANEL
CHOL/HDL RATIO: 3.2 ratio
Cholesterol: 145 mg/dL (ref 0–200)
HDL: 45 mg/dL (ref >39)
LDL CALC: 72 mg/dL (ref 0–99)
Triglycerides: 141 mg/dL (ref <150)
VLDL: 28 mg/dL (ref 0–40)

## 2014-04-21 LAB — TSH: TSH: 2.688 u[IU]/mL (ref 0.350–4.500)

## 2014-04-21 LAB — HEMOGLOBIN, FINGERSTICK: HEMOGLOBIN, FINGERSTICK: 14.3 g/dL (ref 12.0–16.0)

## 2014-04-21 MED ORDER — CLOBETASOL PROPIONATE 0.05 % EX OINT: 1.0000 "application " | TOPICAL_OINTMENT | Freq: Two times a day (BID) | CUTANEOUS | Status: DC

## 2014-04-21 MED ORDER — ELETRIPTAN HYDROBROMIDE 40 MG PO TABS: 40.0000 mg | ORAL_TABLET | ORAL | Status: DC | PRN

## 2014-04-21 NOTE — Progress Notes (Signed)
43 y.o. [id number] Married [place] F here for annual exam.  Has braces now about three months.  Has TMJ and dentist told her she should get braces.  Regrets this decision.  Cycles are regular.  Having a lot more issues with rectal itching.  Saw Dr. Collins ScotlandSpear who told her it was hemorrhoids.  Has used rectal suppositories which caused GI issues.  Did work on the itching.  Dr. Loreta AveMann (a friend) gave her some sort of cream (pt unsure of name) but this didn't make a difference.  Hasn't finished HIDA scan evaluation.  Dietary changes have helped.  Doesn't really feel she needs any additional testing right now.    Patient's last menstrual period was 04/19/2014.          Sexually active: yes  The current method of family planning is condoms.    Exercising: no  not regularly Smoker:  no  Health Maintenance: Pap:  01/01/12 WNL/negative HR HPV History of abnormal Pap:  Yes during pregnancy, biopsy normal MMG:  03/10/13 MMG, 03/24/14-additional views and US, biopsy-6 month diag left and US was due in 11/14 Colonoscopy:  2002, endoscopy-2004 BMD:   none TDaP:  2008 Screening Labs: today, Hb today: 14.3, Urine today: negative   reports that she has never smoked. She has never used smokeless tobacco. She reports that she does not drink alcohol or use illicit drugs.  Past Medical History  Diagnosis Date  . Chest pain   . Palpitations   . SOB (shortness of breath) on exertion   . Hypothyroidism   . Acid reflux   . Thyroid disease   . Migraines   . Thyroid disease     hypothyroidism  . Migraine   . GERD (gastroesophageal reflux disease)   . IBS (irritable bowel syndrome)     Past Surgical History  Procedure Laterality Date  . Cesarean section      x 2  . Anal fissure repair      after second pregnancy    Current Outpatient Prescriptions  Medication Sig Dispense Refill  . acetaminophen (TYLENOL) 325 MG tablet Take 650 mg by mouth every 6 (six) hours as needed for pain.      Marland Kitchen. Dexlansoprazole  (DEXILANT PO) Take 30 mg by mouth daily.      Marland Kitchen. eletriptan (RELPAX) 40 MG tablet Take 40 mg by mouth as needed for migraine or headache. One tablet by mouth at onset of headache. May repeat in 2 hours if headache persists or recurs.      . Fexofenadine HCl (ALLEGRA PO) Take by mouth as needed.      Donald Prose. Flora-Q (FLORA-Q) CAPS Take by mouth as needed.      Marland Kitchen. levothyroxine (SYNTHROID, LEVOTHROID) 100 MCG tablet Take 1 tablet (100 mcg total) by mouth daily.  90 tablet  0  . Multiple Vitamin (MULTIVITAMIN) tablet Take 1 tablet by mouth daily.       No current facility-administered medications for this visit.    Family History  Problem Relation Age of Onset  . Diabetes Father   . Hypertension Father     ROS:  Pertinent items are noted in HPI.  Otherwise, a comprehensive ROS was negative.  Exam:   BP 96/58  Pulse 64  Resp 16  Ht 5' 6.25" (1.683 m)  Wt 113 lb 3.2 oz (51.347 kg)  BMI 18.13 kg/m2  LMP 04/19/2014  Weight change: +3#  Height: 5' 6.25" (168.3 cm)  Ht Readings from Last 3 Encounters:  04/21/14 5'  6.25" (1.683 m)  06/22/13 5\' 7"  (1.702 m)  06/02/13 5\' 7"  (1.702 m)    General appearance: alert, cooperative and appears stated age Head: Normocephalic, without obvious abnormality, atraumatic Neck: no adenopathy, supple, symmetrical, trachea midline and thyroid normal to inspection and palpation Lungs: clear to auscultation bilaterally Breasts: normal appearance, no masses or tenderness Heart: regular rate and rhythm Abdomen: soft, non-tender; bowel sounds normal; no masses,  no organomegaly Extremities: extremities normal, atraumatic, no cyanosis or edema Skin: Skin color, texture, turgor normal. No rashes or lesions Lymph nodes: Cervical, supraclavicular, and axillary nodes normal. No abnormal inguinal nodes palpated Neurologic: Grossly normal   Pelvic: External genitalia:  no lesions              Urethra:  normal appearing urethra with no masses, tenderness or lesions               Bartholins and Skenes: normal                 Vagina: normal appearing vagina with normal color and discharge, no lesions              Cervix: no lesions              Pap taken: yes Bimanual Exam:  Uterus:  normal size, contour, position, consistency, mobility, non-tender              Adnexa: normal adnexa and no mass, fullness, tenderness               Rectovaginal: Confirms               Anus:  normal sphincter tone, no lesions  A:  Well Woman with normal exam H/O breast biopsy in 5/14. PASH diagnosed.  Pt didn't go back for follow up as recommended.   Hypothyroidism Migraines without aura  P:   Mammogram due.  Didn't have follow up after biopsy.  Will schedule for pt.  CMP, lipids, TSH pap smear only today Will need rx for levothyroxine after results are back Relpax 40mg  at headache onset.  #9/12 RF. Clobetasol 0.05% ointment BID x 1 month.  Recheck 1 month return annually or prn  An After Visit Summary was printed and given to the patient.

## 2014-04-22 MED ORDER — LEVOTHYROXINE SODIUM 100 MCG PO TABS: 100.0000 ug | ORAL_TABLET | Freq: Every day | ORAL | Status: DC

## 2014-04-22 NOTE — Addendum Note (Signed)
Addended by: Jerene BearsMILLER, Ariauna Farabee S on: 04/22/2014 10:56 PM   Modules accepted: Orders

## 2014-04-25 ENCOUNTER — Other Ambulatory Visit: Payer: Self-pay

## 2014-04-25 ENCOUNTER — Other Ambulatory Visit: Payer: Self-pay | Admitting: Obstetrics & Gynecology

## 2014-04-25 DIAGNOSIS — N63 Unspecified lump in unspecified breast: Secondary | ICD-10-CM

## 2014-04-25 LAB — IPS PAP SMEAR ONLY

## 2014-05-01 ENCOUNTER — Other Ambulatory Visit: Payer: BC Managed Care – PPO

## 2014-07-07 ENCOUNTER — Telehealth: Payer: Self-pay | Admitting: Emergency Medicine

## 2014-07-07 NOTE — Telephone Encounter (Signed)
Patient was 04 Recall for 09/2013 for follow up. She was asked to return in 6 months for diagnostic left mammogram and ultrasound. This was scheduled at last office visit 03/2014 and patient cancelled appointments at breast center.   Spoke with patient. She completely forgot about this.   Would like me to schedule. Scheduled appointment at the breast center for 07/13/14 at 2:00. Spoke with patient. She is agreeable to this.   Routing to provider for final review. Patient agreeable to disposition. Will close encounter

## 2014-07-13 ENCOUNTER — Ambulatory Visit
Admission: RE | Admit: 2014-07-13 | Discharge: 2014-07-13 | Disposition: A | Discharge status: Home / Self Care | Payer: BC Managed Care – PPO | Source: Ambulatory Visit | Attending: Obstetrics & Gynecology | Admitting: Obstetrics & Gynecology

## 2014-07-13 ENCOUNTER — Encounter (INDEPENDENT_AMBULATORY_CARE_PROVIDER_SITE_OTHER): Payer: Self-pay

## 2014-07-13 DIAGNOSIS — N63 Unspecified lump in unspecified breast: Secondary | ICD-10-CM

## 2014-09-11 ENCOUNTER — Encounter: Payer: Self-pay | Admitting: Obstetrics & Gynecology

## 2015-02-19 ENCOUNTER — Other Ambulatory Visit: Payer: Self-pay | Admitting: Gastroenterology

## 2015-02-19 DIAGNOSIS — R110 Nausea: Secondary | ICD-10-CM

## 2015-03-09 ENCOUNTER — Ambulatory Visit (HOSPITAL_COMMUNITY): Payer: Self-pay

## 2015-03-23 ENCOUNTER — Encounter (HOSPITAL_COMMUNITY)
Admission: RE | Admit: 2015-03-23 | Discharge: 2015-03-23 | Disposition: A | Discharge status: Home / Self Care | Payer: BLUE CROSS/BLUE SHIELD | Source: Ambulatory Visit | Attending: Gastroenterology | Admitting: Gastroenterology

## 2015-03-23 ENCOUNTER — Ambulatory Visit (HOSPITAL_COMMUNITY)
Admission: RE | Admit: 2015-03-23 | Discharge: 2015-03-23 | Disposition: A | Discharge status: Home / Self Care | Payer: BLUE CROSS/BLUE SHIELD | Source: Ambulatory Visit | Attending: Gastroenterology | Admitting: Gastroenterology

## 2015-03-23 DIAGNOSIS — R140 Abdominal distension (gaseous): Secondary | ICD-10-CM | POA: Diagnosis not present

## 2015-03-23 DIAGNOSIS — R110 Nausea: Secondary | ICD-10-CM | POA: Insufficient documentation

## 2015-03-23 MED ORDER — SINCALIDE 5 MCG IJ SOLR
0.0200 ug/kg | Freq: Once | INTRAMUSCULAR | Status: AC
  Administered 2015-03-23: 1 ug via INTRAVENOUS

## 2015-03-23 MED ORDER — TECHNETIUM TC 99M MEBROFENIN IV KIT
5.5000 | PACK | Freq: Once | INTRAVENOUS | Status: AC | PRN
  Administered 2015-03-23: 6 via INTRAVENOUS

## 2015-04-02 ENCOUNTER — Other Ambulatory Visit: Payer: Self-pay | Admitting: Gastroenterology

## 2015-04-02 DIAGNOSIS — R110 Nausea: Secondary | ICD-10-CM

## 2015-04-05 ENCOUNTER — Ambulatory Visit (HOSPITAL_COMMUNITY): Payer: BLUE CROSS/BLUE SHIELD

## 2015-04-16 ENCOUNTER — Ambulatory Visit (INDEPENDENT_AMBULATORY_CARE_PROVIDER_SITE_OTHER): Payer: BLUE CROSS/BLUE SHIELD | Admitting: Nurse Practitioner

## 2015-04-16 ENCOUNTER — Encounter: Payer: Self-pay | Admitting: Nurse Practitioner

## 2015-04-16 VITALS — BP 92/60 | HR 72 | Temp 97.8°F | Resp 16 | Ht 66.25 in | Wt 115.0 lb

## 2015-04-16 DIAGNOSIS — R300 Dysuria: Secondary | ICD-10-CM

## 2015-04-16 LAB — POCT URINALYSIS DIPSTICK
Bilirubin, UA: NEGATIVE
Glucose, UA: NEGATIVE
KETONES UA: NEGATIVE
NITRITE UA: NEGATIVE
PROTEIN UA: NEGATIVE
Urobilinogen, UA: NEGATIVE
pH, UA: 8

## 2015-04-16 MED ORDER — NITROFURANTOIN MONOHYD MACRO 100 MG PO CAPS: 100.0000 mg | ORAL_CAPSULE | Freq: Two times a day (BID) | ORAL | Status: DC

## 2015-04-16 NOTE — Progress Notes (Signed)
S:  44 y.o.Married [place] female presents with complaint of UTI. Symptoms began since yesterday.  But became worse this am. Felt chilled today but no fever.    With symptoms of dysuria, urinary frequency, urinary urgency.  Pertinent negatives include no constitutional symptoms, denying fever, chills, anorexia, or weight loss.   Sexually active: yes.  Symptoms may be related to post coital.  Current method of birth control is condoms.   There is some vaginal dryness.  Same partner without change. Last UTI documented 2 years ago. No history of renal calculi.  ROS: no weight loss, fever, night sweats and feels well  O alert, oriented to person, place, and time, normal mood, behavior, speech, dress, motor activity, and thought processes   thin, healthy,  alert and  not in acute distress  Abdomen:  Mild pressure over the bladder  No CVA tenderness  Pelvic: deferred   Diagnostic Test:    Urinalysis: moderate RBC and leuk's   urine culture  Assessment: R/O UTI   Plan:       Maintain adequate hydration. Follow up if symptoms not improving, and as needed.   Medication Therapy: Macrobid 100 mg BID # 14   Lab:  Will follow urine culture and micro   RV

## 2015-04-16 NOTE — Patient Instructions (Signed)

## 2015-04-17 ENCOUNTER — Telehealth: Payer: Self-pay | Admitting: Nurse Practitioner

## 2015-04-17 ENCOUNTER — Other Ambulatory Visit: Payer: Self-pay | Admitting: Nurse Practitioner

## 2015-04-17 LAB — URINALYSIS, MICROSCOPIC ONLY
Bacteria, UA: NONE SEEN
Casts: NONE SEEN
Crystals: NONE SEEN
Squamous Epithelial / LPF: NONE SEEN

## 2015-04-17 MED ORDER — PHENAZOPYRIDINE HCL 200 MG PO TABS: 200.0000 mg | ORAL_TABLET | Freq: Three times a day (TID) | ORAL | Status: DC | PRN

## 2015-04-17 NOTE — Telephone Encounter (Signed)
Pt states she was here for an appointment 04/16/15 for UTI. Pt states she felt better after the appointment and did not take the medication prescribed(Macrobid). Pt woke up this morning with increased pain and noticed blood after urination she proceeded to take the first does of medication and is concerned as to wether she should continue or be seen for further testing. Pt agreeable to a callback from triage.  Chart in triage

## 2015-04-17 NOTE — Telephone Encounter (Signed)
Spoke with patient. Patient was seen 04/16/2015 for UTI symptoms. Was given Macrobid to start. Urine culture was sent. Patient states she did not start taking medication yesterday because she had symptom relief after appointment. Woke up this morning and began experiencing burning with urination and blood with wiping. Patient started to taking Macrobid this morning. Patient would like to know if she needs to continue taking medication or needs to be seen again. Advised patient needs to continue taking Macrobid at this time for treatment of UTI symptoms. Advised once urine culture has return may need to switch antibiotic. Patient is agreeable. Patient requesting Pyridium rx as discussed with Lauro FranklinPatricia Rolen-Grubb, FNP yesterday for symptoms relief. Advised will send a message over to Lauro FranklinPatricia Rolen-Grubb, FNP and return call with further recommendations. Patient is agreeable.  Lauro FranklinPatricia Rolen-Grubb, FNP okay to send in pyridium at this time?

## 2015-04-17 NOTE — Telephone Encounter (Signed)
RX has been sent in.  

## 2015-04-18 LAB — URINE CULTURE

## 2015-04-18 NOTE — Telephone Encounter (Signed)
Please review and advise on results from 04/16/2015.

## 2015-04-18 NOTE — Progress Notes (Signed)
Encounter reviewed by Dr. Brook Silva.  

## 2015-04-20 NOTE — Telephone Encounter (Signed)
-----   Message from Ria Comment, FNP sent at 04/18/2015  5:53 PM EDT ----- Please let pt. Know that urine culture was positive for multiple bacteria - perhaps some contamination.  Have her to finish the antibiotics and lets do TOC for repeat urine culture in 2 weeks with a nurse visit.

## 2015-04-20 NOTE — Telephone Encounter (Signed)
Left message to call Shaley Leavens at [phone]. 

## 2015-04-20 NOTE — Telephone Encounter (Signed)
Spoke with patient. Advised of results as seen below from Lauro Franklin, FNP. Patient is agreeable and verbalizes understanding. Patient has aex appointment on 6/28 with Dr.Miller and would like to have urine TOC at this appointment. Advised this will be okay. Will place note on appointment.  Cc: Dr.Miller  Routing to provider for final review. Patient agreeable to disposition. Will close encounter.

## 2015-04-24 ENCOUNTER — Ambulatory Visit (HOSPITAL_COMMUNITY)
Admission: RE | Admit: 2015-04-24 | Discharge: 2015-04-24 | Disposition: A | Discharge status: Home / Self Care | Payer: BLUE CROSS/BLUE SHIELD | Source: Ambulatory Visit | Attending: Gastroenterology | Admitting: Gastroenterology

## 2015-04-24 DIAGNOSIS — R110 Nausea: Secondary | ICD-10-CM | POA: Insufficient documentation

## 2015-04-24 DIAGNOSIS — R140 Abdominal distension (gaseous): Secondary | ICD-10-CM | POA: Insufficient documentation

## 2015-04-24 MED ORDER — TECHNETIUM TC 99M SULFUR COLLOID
2.1000 | Freq: Once | INTRAVENOUS | Status: AC | PRN
  Administered 2015-04-24: 2.1 via INTRAVENOUS

## 2015-05-08 ENCOUNTER — Encounter: Payer: Self-pay | Admitting: Obstetrics & Gynecology

## 2015-05-08 ENCOUNTER — Ambulatory Visit (INDEPENDENT_AMBULATORY_CARE_PROVIDER_SITE_OTHER): Payer: BLUE CROSS/BLUE SHIELD | Admitting: Obstetrics & Gynecology

## 2015-05-08 ENCOUNTER — Telehealth: Payer: Self-pay | Admitting: Obstetrics & Gynecology

## 2015-05-08 VITALS — BP 94/60 | HR 64 | Resp 16 | Ht 65.75 in | Wt 111.0 lb

## 2015-05-08 DIAGNOSIS — Z0000 Encounter for general adult medical examination without abnormal findings: Secondary | ICD-10-CM

## 2015-05-08 DIAGNOSIS — N390 Urinary tract infection, site not specified: Secondary | ICD-10-CM | POA: Diagnosis not present

## 2015-05-08 DIAGNOSIS — E038 Other specified hypothyroidism: Secondary | ICD-10-CM

## 2015-05-08 DIAGNOSIS — Z124 Encounter for screening for malignant neoplasm of cervix: Secondary | ICD-10-CM | POA: Diagnosis not present

## 2015-05-08 DIAGNOSIS — Z01419 Encounter for gynecological examination (general) (routine) without abnormal findings: Secondary | ICD-10-CM | POA: Diagnosis not present

## 2015-05-08 LAB — CBC
HCT: 40.1 % (ref 36.0–46.0)
HEMOGLOBIN: 13.2 g/dL (ref 12.0–15.0)
MCH: 30.1 pg (ref 26.0–34.0)
MCHC: 32.9 g/dL (ref 30.0–36.0)
MCV: 91.3 fL (ref 78.0–100.0)
MPV: 8.8 fL (ref 8.6–12.4)
Platelets: 290 10*3/uL (ref 150–400)
RBC: 4.39 MIL/uL (ref 3.87–5.11)
RDW: 13.2 % (ref 11.5–15.5)
WBC: 6.6 10*3/uL (ref 4.0–10.5)

## 2015-05-08 LAB — COMPREHENSIVE METABOLIC PANEL
ALT: 13 U/L (ref 0–35)
AST: 17 U/L (ref 0–37)
Albumin: 4.2 g/dL (ref 3.5–5.2)
Alkaline Phosphatase: 62 U/L (ref 39–117)
BILIRUBIN TOTAL: 0.7 mg/dL (ref 0.2–1.2)
BUN: 10 mg/dL (ref 6–23)
CO2: 27 mEq/L (ref 19–32)
CREATININE: 0.66 mg/dL (ref 0.50–1.10)
Calcium: 8.9 mg/dL (ref 8.4–10.5)
Chloride: 102 mEq/L (ref 96–112)
Glucose, Bld: 79 mg/dL (ref 70–99)
Potassium: 4.4 mEq/L (ref 3.5–5.3)
SODIUM: 139 meq/L (ref 135–145)
TOTAL PROTEIN: 6.6 g/dL (ref 6.0–8.3)

## 2015-05-08 LAB — LIPID PANEL
CHOLESTEROL: 123 mg/dL (ref 0–200)
HDL: 35 mg/dL — AB (ref >=46)
LDL Cholesterol: 60 mg/dL (ref 0–99)
TRIGLYCERIDES: 140 mg/dL (ref <150)
Total CHOL/HDL Ratio: 3.5 Ratio
VLDL: 28 mg/dL (ref 0–40)

## 2015-05-08 MED ORDER — PAROXETINE HCL 10 MG PO TABS: 10.0000 mg | ORAL_TABLET | ORAL | Status: DC

## 2015-05-08 NOTE — Addendum Note (Signed)
Addended by: Elisha HeadlandNIX, Plummer Matich S on: 05/08/2015 04:58 PM   Modules accepted: Orders, SmartSet

## 2015-05-08 NOTE — Progress Notes (Signed)
44 y.o. [id number] Married [place] F here for annual exam.   Reports having a lot of nausea.  Has been seeing Dr. Loreta Ave and has undergone a endoscopy and HIDA scan.  HIDA scan showed low gallbladder function.  Reports she did have a repeat HIDA scan and then it was normal.  Is still considering surgery.    Has hx of hypothyroidism.  Was told several tines by Dr. Collins Scotland (not in practice any more) that she would like her on a higher dosage of synthroid but she was worried about pt having weight loss.    Patient's last menstrual period was 05/07/2015 (exact date).          Sexually active: Yes.    The current method of family planning is condoms.    Exercising: No.  not regularly Smoker:  no  Health Maintenance: Pap:  04/21/14 WNL, endometrial cells on pap History of abnormal Pap:  yes MMG:  07/13/14 bilat diag/us-screening in one year Colonoscopy:  2002, 6/16 endoscopy with Dr Loreta Ave, may have a colonoscopy soon BMD:   none TDaP:  2008 Screening Labs: today, Hb today: 12.5, Urine today: will send off urine TOC   reports that she has never smoked. She has never used smokeless tobacco. She reports that she does not drink alcohol or use illicit drugs.  Past Medical History  Diagnosis Date  . Chest pain   . Palpitations   . SOB (shortness of breath) on exertion   . Hypothyroidism   . Acid reflux   . Thyroid disease   . Migraines   . Thyroid disease     hypothyroidism  . Migraine   . GERD (gastroesophageal reflux disease)   . IBS (irritable bowel syndrome)     Past Surgical History  Procedure Laterality Date  . Cesarean section      x 2  . Anal fissure repair      after second pregnancy    Current Outpatient Prescriptions  Medication Sig Dispense Refill  . DEXILANT 60 MG capsule   2  . eletriptan (RELPAX) 40 MG tablet Take 1 tablet (40 mg total) by mouth as needed for migraine or headache. One tablet by mouth at onset of headache. May repeat in 2 hours if headache persists or recurs. 9  tablet 12  . Flora-Q (FLORA-Q) CAPS Take by mouth as needed.    Marland Kitchen levothyroxine (SYNTHROID, LEVOTHROID) 100 MCG tablet Take 1 tablet (100 mcg total) by mouth daily. 90 tablet 4  . Multiple Vitamin (MULTIVITAMIN) tablet Take 1 tablet by mouth daily.    . nitrofurantoin, macrocrystal-monohydrate, (MACROBID) 100 MG capsule Take 1 capsule (100 mg total) by mouth 2 (two) times daily. (Patient not taking: Reported on 05/08/2015) 14 capsule 0  . phenazopyridine (PYRIDIUM) 200 MG tablet Take 1 tablet (200 mg total) by mouth 3 (three) times daily as needed for pain. (Patient not taking: Reported on 05/08/2015) 10 tablet 0   No current facility-administered medications for this visit.    Family History  Problem Relation Age of Onset  . Diabetes Father   . Hypertension Father     ROS:  Pertinent items are noted in HPI.  Otherwise, a comprehensive ROS was negative.  Exam:   BP 94/60 mmHg  Pulse 64  Resp 16  Ht 5' 5.75" (1.67 m)  Wt 111 lb (50.349 kg)  BMI 18.05 kg/m2  LMP 05/07/2015 (Exact Date)  Weight change: -2#    Height: 5' 5.75" (167 cm)  Ht Readings from Last  3 Encounters:  05/08/15 5' 5.75" (1.67 m)  04/16/15 5' 6.25" (1.683 m)  04/21/14 5' 6.25" (1.683 m)    General appearance: alert, cooperative and appears stated age Head: Normocephalic, without obvious abnormality, atraumatic Neck: no adenopathy, supple, symmetrical, trachea midline and thyroid normal to inspection and palpation Lungs: clear to auscultation bilaterally Breasts: normal appearance, no masses or tenderness Heart: regular rate and rhythm Abdomen: soft, non-tender; bowel sounds normal; no masses,  no organomegaly Extremities: extremities normal, atraumatic, no cyanosis or edema Skin: Skin color, texture, turgor normal. No rashes or lesions Lymph nodes: Cervical, supraclavicular, and axillary nodes normal. No abnormal inguinal nodes palpated Neurologic: Grossly normal   Pelvic: External genitalia:  no lesions               Urethra:  normal appearing urethra with no masses, tenderness or lesions              Bartholins and Skenes: normal                 Vagina: normal appearing vagina with normal color and discharge, no lesions              Cervix: no lesions              Pap taken: Yes.   Bimanual Exam:  Uterus:  normal size, contour, position, consistency, mobility, non-tender              Adnexa: normal adnexa and no mass, fullness, tenderness               Rectovaginal: Confirms               Anus:  normal sphincter tone, no lesions  Chaperone was present for exam.  A:  Well Woman with normal exam Hypothyroidism Nausea that she feels is very dietary related Possible decreased gall bladder function History IBS Anxiety  P: Mammogram yearly Pap today CBC, Lipid, TSH with thyroid panel, Vit D, CMP Referral to endocrinology Paxil 10mg  daily.  Pt commits to try for a month.  Recheck 1 month. return annually or prn

## 2015-05-08 NOTE — Telephone Encounter (Signed)
Patient asked to schedule her daughter as NGYN with Dr.Miller. Patient said Dr.Miller told her she would see her daughter. Patient needs appointment before 06/19/15

## 2015-05-08 NOTE — Addendum Note (Signed)
Addended by: Jerene BearsMILLER, Cheris Tweten S on: 05/08/2015 03:58 PM   Modules accepted: Kipp BroodSmartSet

## 2015-05-09 LAB — THYROID PANEL WITH TSH
Free Thyroxine Index: 3.6 (ref 1.4–3.8)
T3 Uptake: 32 % (ref 22–35)
T4, Total: 11.2 ug/dL (ref 4.5–12.0)
TSH: 1.158 u[IU]/mL (ref 0.350–4.500)

## 2015-05-09 LAB — URINE CULTURE

## 2015-05-09 LAB — VITAMIN D 25 HYDROXY (VIT D DEFICIENCY, FRACTURES): VIT D 25 HYDROXY: 22 ng/mL — AB (ref 30–100)

## 2015-05-10 LAB — IPS PAP TEST WITH REFLEX TO HPV

## 2015-05-11 ENCOUNTER — Other Ambulatory Visit: Payer: Self-pay | Admitting: Obstetrics & Gynecology

## 2015-05-11 MED ORDER — LEVOTHYROXINE SODIUM 100 MCG PO TABS: 100.0000 ug | ORAL_TABLET | Freq: Every day | ORAL | Status: DC

## 2015-05-11 NOTE — Telephone Encounter (Signed)
Pt states she takes synthroid and had labs drawn during recent appointment. States she did not take her medication because waiting on lab results that may result in dosage change. Patient is now out. Patient states she is leaving to go out of town tomorrow and requests refill asap.  Pharmacy: [facility] [address]

## 2015-05-11 NOTE — Telephone Encounter (Signed)
Medication refill request: Synthroid 100 Last AEX:  05/08/15 with SM Next AEX: 08/22/16 with SM Last TSH 1.158 drawn on 05/08/15 Refill authorized: Please advise.

## 2015-05-18 NOTE — Telephone Encounter (Signed)
Spoke with patient and she will call back to schedule her daughter when she is back in town.

## 2015-05-25 ENCOUNTER — Telehealth: Payer: Self-pay

## 2015-05-25 MED ORDER — VITAMIN D (ERGOCALCIFEROL) 1.25 MG (50000 UNIT) PO CAPS: 50000.0000 [IU] | ORAL_CAPSULE | ORAL | Status: DC

## 2015-05-25 NOTE — Telephone Encounter (Signed)
Patient notified of all results. Aware Rx being sent to pharmacy for Vitamin D. Patient has 1 month follow up with Dr Hyacinth MeekerMiller on 06/07/15 and we can schedule her 3 month follow labs at that time.

## 2015-06-07 ENCOUNTER — Ambulatory Visit (INDEPENDENT_AMBULATORY_CARE_PROVIDER_SITE_OTHER): Payer: BLUE CROSS/BLUE SHIELD | Admitting: Obstetrics & Gynecology

## 2015-06-07 VITALS — BP 88/62 | HR 64 | Resp 16 | Wt 112.0 lb

## 2015-06-07 DIAGNOSIS — F411 Generalized anxiety disorder: Secondary | ICD-10-CM | POA: Diagnosis not present

## 2015-06-07 DIAGNOSIS — K589 Irritable bowel syndrome without diarrhea: Secondary | ICD-10-CM

## 2015-06-17 ENCOUNTER — Encounter: Payer: Self-pay | Admitting: Obstetrics & Gynecology

## 2015-06-17 NOTE — Progress Notes (Signed)
Patient ID: Chloe Santos, female   DOB: [birth year], 44 y.o.   MRN: [id number]  44 y.o. Married [place] female [phone] here for follow up of after starting Paxil  after AEX 05/08/15.  Pt is actually taking 1/2 tab daily but does feel her nausea has resolved.  She can tell she is worrying less about things at home.  Did not have worsening nausea when she started it.  Denies any other side effects.  Pt is planning on staying on this medication.  Encouraged pt to let me know if she decides she wants to stop this as I would like to opportunity to talk with her to encourage her to stay on it.  O: Healthy WD,WN female Affect: normal  A:  Anxiety IBS  P: Continue Paxil  daily.  Encouraged pt to increase dosage for, hopefully, improvement in symptoms. Pt encouraged to call if thinking about stopping medication or if has any new side effects.

## 2015-08-02 ENCOUNTER — Ambulatory Visit (INDEPENDENT_AMBULATORY_CARE_PROVIDER_SITE_OTHER): Payer: BLUE CROSS/BLUE SHIELD | Admitting: Obstetrics & Gynecology

## 2015-08-02 ENCOUNTER — Encounter: Payer: Self-pay | Admitting: Obstetrics & Gynecology

## 2015-08-02 ENCOUNTER — Other Ambulatory Visit: Payer: BLUE CROSS/BLUE SHIELD

## 2015-08-02 ENCOUNTER — Other Ambulatory Visit: Payer: Self-pay | Admitting: Obstetrics & Gynecology

## 2015-08-02 VITALS — BP 122/72 | HR 72 | Resp 16 | Wt 117.0 lb

## 2015-08-02 DIAGNOSIS — F411 Generalized anxiety disorder: Secondary | ICD-10-CM | POA: Diagnosis not present

## 2015-08-02 DIAGNOSIS — L659 Nonscarring hair loss, unspecified: Secondary | ICD-10-CM | POA: Diagnosis not present

## 2015-08-02 LAB — TSH: TSH: 2.27 u[IU]/mL (ref 0.350–4.500)

## 2015-08-02 MED ORDER — ELETRIPTAN HYDROBROMIDE 40 MG PO TABS: 40.0000 mg | ORAL_TABLET | ORAL | Status: DC | PRN

## 2015-08-02 MED ORDER — PAROXETINE HCL 10 MG PO TABS: 10.0000 mg | ORAL_TABLET | ORAL | Status: DC

## 2015-08-02 NOTE — Telephone Encounter (Signed)
Patient requesting refills of Valtrex 1 gm tablet and Relpax 40 mg sent to [facility] on [address] Best Callback for patient: [phone]

## 2015-08-02 NOTE — Progress Notes (Signed)
Patient ID: Chloe Santos, female   DOB: 1970/11/18, 44 y.o.   MRN: [id number]  44 yo [place] female here for follow up after starting Paxil.  Started with  daily and has now increased to  daily.  Pt reports her nausea has completely resolved.  She is eating much more normally.  She is sleeping well.  The biggest issue she reports is some afternoon fatigue.  Patient is sure she is gaining weight.    Daughter is a Printmaker at [employer].  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  Daughter is going to [place] for fall break.    Patient also reports she is having some hair thinning.  Pt would like to have some blood work.    Pt did not start the Vit D.  She doesn't want to take it because she didn't want it to make her GI issues worse.  She is considering starting this again.    Assessment:  Anxiety Nausea that seems to have resolved IBS with bloating  Plan:  TSH and testosterone Paxil  daily.  Rx to pharmacy. Pt will consider if she wants to start the Vit D.  ~15 minutes spent with patient >50% of time was in face to face discussion of above.

## 2015-08-02 NOTE — Telephone Encounter (Signed)
Medication refill request: Valtrex and relpax 40 mg Last AEX:  05/08/15 Next AEX: 08/12/16 SM Last MMG (if hormpleonal medication request): 07/13/14 BIRADS2:Benign  Refill authorized: please advise.

## 2015-08-03 LAB — TESTOSTERONE: TESTOSTERONE: 33 ng/dL (ref 10–70)

## 2015-08-03 NOTE — Telephone Encounter (Signed)
relpax faxed to pharmacy

## 2015-08-06 ENCOUNTER — Other Ambulatory Visit: Payer: Self-pay | Admitting: Obstetrics & Gynecology

## 2015-08-06 DIAGNOSIS — Z1231 Encounter for screening mammogram for malignant neoplasm of breast: Secondary | ICD-10-CM

## 2015-08-09 ENCOUNTER — Ambulatory Visit
Admission: RE | Admit: 2015-08-09 | Discharge: 2015-08-09 | Disposition: A | Discharge status: Home / Self Care | Payer: BLUE CROSS/BLUE SHIELD | Source: Ambulatory Visit | Attending: Obstetrics & Gynecology | Admitting: Obstetrics & Gynecology

## 2015-08-09 DIAGNOSIS — Z1231 Encounter for screening mammogram for malignant neoplasm of breast: Secondary | ICD-10-CM

## 2015-08-13 ENCOUNTER — Other Ambulatory Visit: Payer: Self-pay | Admitting: Obstetrics & Gynecology

## 2015-08-13 DIAGNOSIS — R928 Other abnormal and inconclusive findings on diagnostic imaging of breast: Secondary | ICD-10-CM

## 2015-08-22 ENCOUNTER — Ambulatory Visit
Admission: RE | Admit: 2015-08-22 | Discharge: 2015-08-22 | Disposition: A | Discharge status: Home / Self Care | Payer: BLUE CROSS/BLUE SHIELD | Source: Ambulatory Visit | Attending: Obstetrics & Gynecology | Admitting: Obstetrics & Gynecology

## 2015-08-22 DIAGNOSIS — R928 Other abnormal and inconclusive findings on diagnostic imaging of breast: Secondary | ICD-10-CM

## 2015-11-23 ENCOUNTER — Telehealth: Payer: Self-pay | Admitting: Emergency Medicine

## 2015-11-23 NOTE — Telephone Encounter (Signed)
[facility]  Covered medications are Generic Sumatriptan, Naratriptan, rizatriptan.   Called [facility], patient has not picked up Relpax since 09/2013, has not filled 07/2015 sent in by Dr. Hyacinth MeekerMiller.

## 2016-01-01 ENCOUNTER — Telehealth: Payer: Self-pay | Admitting: Obstetrics & Gynecology

## 2016-01-01 NOTE — Telephone Encounter (Signed)
Patient is asking for a new prescription for DEXILANT 60 MG capsule. Confirmed pharmacy with patient.

## 2016-01-01 NOTE — Telephone Encounter (Signed)
The patient is requesting medication for Dexilant 60 mg for GERD. She was last seen in the office on 08/02/2015. Last aex was 05/08/2015. This medication is in the patient's chart, but I do not see that our office has ever prescribed this for her. Spoke with patient who states that she is in between primary care physicians and was hoping Dr.Miller could refill this prescription for her until she establishes a new PCP. Advised I will speak with Dr.Miller and return call. She is agreeable. Pharmacy on file is up to date.

## 2016-01-03 MED ORDER — DEXILANT 60 MG PO CPDR: 60.0000 mg | DELAYED_RELEASE_CAPSULE | Freq: Every day | ORAL | Status: DC

## 2016-01-03 NOTE — Telephone Encounter (Signed)
I will do this RF for 3 months but this needs to come from her PCP or GI in the future.

## 2016-01-03 NOTE — Telephone Encounter (Signed)
Patient calling to check on status of the request. °

## 2016-01-04 NOTE — Telephone Encounter (Signed)
Spoke with patient. Advised of message as seen below from Dr.Miller. She is agreeable and verbalizes understanding.  Routing to provider for final review. Patient agreeable to disposition. Will close encounter.  

## 2016-01-17 ENCOUNTER — Other Ambulatory Visit: Payer: Self-pay | Admitting: *Deleted

## 2016-01-17 MED ORDER — VALACYCLOVIR HCL 1 G PO TABS: ORAL_TABLET | ORAL | Status: AC

## 2016-01-17 NOTE — Telephone Encounter (Signed)
Medication refill request: Valtrex 1GM (not on current med list) Last AEX:  05-08-15 Next AEX: 08-12-16 Last MMG (if hormonal medication request): 08-22-15 WNL Refill authorized: please advise

## 2016-04-11 ENCOUNTER — Other Ambulatory Visit: Payer: Self-pay | Admitting: Obstetrics & Gynecology

## 2016-04-11 MED ORDER — LEVOTHYROXINE SODIUM 100 MCG PO TABS: 100.0000 ug | ORAL_TABLET | Freq: Every day | ORAL | Status: DC

## 2016-04-11 MED ORDER — PAROXETINE HCL 10 MG PO TABS: 10.0000 mg | ORAL_TABLET | ORAL | Status: DC

## 2016-04-11 NOTE — Telephone Encounter (Signed)
Medication refill request: Paxil & Synthroid  Last AEX:  05-08-15 Next AEX: 08-12-16  Last MMG (if hormonal medication request): 08-22-15 WNL Refill authorized: please advise

## 2016-04-11 NOTE — Telephone Encounter (Signed)
Patient is going out of the country for the summer to [place] for 3 months and needs prescriptions for generic paxil and synthroid. The pharmacy on file told her to call us to request refills on both of these.

## 2016-06-23 ENCOUNTER — Ambulatory Visit (INDEPENDENT_AMBULATORY_CARE_PROVIDER_SITE_OTHER): Payer: BLUE CROSS/BLUE SHIELD | Admitting: Obstetrics & Gynecology

## 2016-06-23 ENCOUNTER — Encounter: Payer: Self-pay | Admitting: Obstetrics & Gynecology

## 2016-06-23 VITALS — BP 110/62 | HR 72 | Resp 16 | Ht 66.25 in | Wt 120.8 lb

## 2016-06-23 DIAGNOSIS — Z01419 Encounter for gynecological examination (general) (routine) without abnormal findings: Secondary | ICD-10-CM

## 2016-06-23 DIAGNOSIS — R899 Unspecified abnormal finding in specimens from other organs, systems and tissues: Secondary | ICD-10-CM | POA: Diagnosis not present

## 2016-06-23 DIAGNOSIS — K589 Irritable bowel syndrome without diarrhea: Secondary | ICD-10-CM | POA: Diagnosis not present

## 2016-06-23 DIAGNOSIS — Z1389 Encounter for screening for other disorder: Secondary | ICD-10-CM

## 2016-06-23 DIAGNOSIS — R7989 Other specified abnormal findings of blood chemistry: Secondary | ICD-10-CM

## 2016-06-23 DIAGNOSIS — B351 Tinea unguium: Secondary | ICD-10-CM

## 2016-06-23 DIAGNOSIS — Z0000 Encounter for general adult medical examination without abnormal findings: Secondary | ICD-10-CM | POA: Diagnosis not present

## 2016-06-23 LAB — LIPID PANEL
CHOL/HDL RATIO: 3.7 ratio (ref <=5.0)
CHOLESTEROL: 156 mg/dL (ref 125–200)
HDL: 42 mg/dL — AB (ref >=46)
LDL Cholesterol: 61 mg/dL (ref <130)
Triglycerides: 266 mg/dL — ABNORMAL HIGH (ref <150)
VLDL: 53 mg/dL — ABNORMAL HIGH (ref <30)

## 2016-06-23 LAB — COMPREHENSIVE METABOLIC PANEL
ALK PHOS: 75 U/L (ref 33–115)
ALT: 14 U/L (ref 6–29)
AST: 15 U/L (ref 10–35)
Albumin: 4.2 g/dL (ref 3.6–5.1)
BUN: 11 mg/dL (ref 7–25)
CALCIUM: 9.2 mg/dL (ref 8.6–10.2)
CO2: 28 mmol/L (ref 20–31)
Chloride: 102 mmol/L (ref 98–110)
Creat: 0.71 mg/dL (ref 0.50–1.10)
Glucose, Bld: 83 mg/dL (ref 65–99)
POTASSIUM: 4.6 mmol/L (ref 3.5–5.3)
Sodium: 136 mmol/L (ref 135–146)
TOTAL PROTEIN: 6.7 g/dL (ref 6.1–8.1)
Total Bilirubin: 0.5 mg/dL (ref 0.2–1.2)

## 2016-06-23 LAB — TSH: TSH: 3.26 mIU/L

## 2016-06-23 LAB — POCT URINALYSIS DIPSTICK
BILIRUBIN UA: NEGATIVE
GLUCOSE UA: NEGATIVE
KETONES UA: NEGATIVE
Leukocytes, UA: NEGATIVE
NITRITE UA: NEGATIVE
Protein, UA: NEGATIVE
RBC UA: NEGATIVE
Urobilinogen, UA: NEGATIVE
pH, UA: 5

## 2016-06-23 LAB — CBC
HEMATOCRIT: 39.3 % (ref 35.0–45.0)
Hemoglobin: 12.7 g/dL (ref 11.7–15.5)
MCH: 29.7 pg (ref 27.0–33.0)
MCHC: 32.3 g/dL (ref 32.0–36.0)
MCV: 91.8 fL (ref 80.0–100.0)
MPV: 10.2 fL (ref 7.5–12.5)
Platelets: 254 10*3/uL (ref 140–400)
RBC: 4.28 MIL/uL (ref 3.80–5.10)
RDW: 14 % (ref 11.0–15.0)
WBC: 8.3 10*3/uL (ref 3.8–10.8)

## 2016-06-23 MED ORDER — CICLOPIROX 8 % EX SOLN: CUTANEOUS | 2 refills | Status: DC

## 2016-06-23 MED ORDER — PAROXETINE HCL 10 MG PO TABS: 10.0000 mg | ORAL_TABLET | ORAL | 4 refills | Status: DC

## 2016-06-23 MED ORDER — CLOBETASOL PROPIONATE 0.05 % EX OINT: 1.0000 "application " | TOPICAL_OINTMENT | Freq: Two times a day (BID) | CUTANEOUS | 0 refills | Status: DC

## 2016-06-23 NOTE — Progress Notes (Signed)
45 y.o. G5P2002 Married [place] F here for annual exam.  Doing well.  Cycles normal.  Just got back from [place], two days ago.  Noted when in [place], and off her Dexilant, her GI issues were completely gone.  She has a friend, who is a cardiologist, who has suggested she have a cardiac work up to ensure it isn't cardiac.  Pt saw Dr. Allyson Sabal in 2014.  Had EKG and echo that were normal.  She does not want a referral at this time and I agree as she had evaluation then and was having similar symptoms.    She does think the Paxil has helped and she has gained weight--9 #.  Pt had undergone extensive GI evaluation due to intermittent distention, reflux.  Cholecystectomy was recommended.  With complete resolution of symptoms, do not think this is going to help her.  She feels the same.  D/w pt doing a food diary, esp since she has only been back in the [place] from [place] for two full days.  Maybe she will be able to figure out some food triggers for her.  She likes this idea.  Pt also asked for RF on "steroid ointment" that she's had for a few years.  Uses occasionally when has perirectal irritation after more loose stools.  Has what looks like fungus on right index finger.  Would like to take antifungal, orally.  D/W pt need for liver monitoring and as is only on one finger, maybe topical agent would work in this situation.  Pt did not know antifungals needed liver function testing monitoring.  Does not want that but would like to try topical agent.  Patient's last menstrual period was 06/12/2016.          Sexually active: Yes.    The current method of family planning is condoms most of the time.    Exercising: No.  The patient does not participate in regular exercise at present. Smoker:  no  Health Maintenance: Pap:  05/08/15 Pap and HR HPV negative History of abnormal Pap:  yes MMG:  08/22/15 BIRADS1 negative Colonoscopy:  n/a BMD:   n/a TDaP:  2008 Screening Labs: today, Hb today: 12.7, Urine today:  Normal   reports that she has never smoked. She has never used smokeless tobacco. She reports that she does not drink alcohol or use drugs.  Past Medical History:  Diagnosis Date  . Acid reflux   . Chest pain   . GERD (gastroesophageal reflux disease)   . Hypothyroidism   . IBS (irritable bowel syndrome)   . Migraine   . Migraines   . Palpitations   . SOB (shortness of breath) on exertion   . Thyroid disease   . Thyroid disease    hypothyroidism    Past Surgical History:  Procedure Laterality Date  . ANAL FISSURE REPAIR     after second pregnancy  . CESAREAN SECTION     x 2    Current Outpatient Prescriptions  Medication Sig Dispense Refill  . DEXILANT 60 MG capsule Take 1 capsule (60 mg total) by mouth daily. 30 capsule 2  . eletriptan (RELPAX) 40 MG tablet Take 1 tablet (40 mg total) by mouth as needed for migraine or headache. One tablet by mouth at onset of headache. May repeat in 2 hours if headache persists or recurs. 9 tablet 12  . Flora-Q (FLORA-Q) CAPS Take by mouth as needed.    Marland Kitchen levothyroxine (SYNTHROID, LEVOTHROID) 100 MCG tablet Take 1 tablet (100  mcg total) by mouth daily. 90 tablet 1  . Multiple Vitamin (MULTIVITAMIN) tablet Take 1 tablet by mouth daily.    Marland Kitchen. PARoxetine (PAXIL) 10 MG tablet Take 1 tablet (10 mg total) by mouth every morning. 90 tablet 0  . Cholecalciferol (VITAMIN D PO) Take 5,000 Int'l Units/day by mouth.    . valACYclovir (VALTREX) 1000 MG tablet Take as directed (Patient not taking: Reported on 06/23/2016) 30 tablet 0   No current facility-administered medications for this visit.     Family History  Problem Relation Age of Onset  . Diabetes Father   . Hypertension Father     ROS:  Pertinent items are noted in HPI.  Otherwise, a comprehensive ROS was negative.  Exam:   BP 110/62 (BP Location: Right Arm, Patient Position: Sitting, Cuff Size: Normal)   Pulse 72   Resp 16   Ht 5' 6.25" (1.683 m)   Wt 120 lb 12.8 oz (54.8 kg)   LMP  06/12/2016   BMI 19.35 kg/m   Weight change: +9#  Height: 5' 6.25" (168.3 cm)  Ht Readings from Last 3 Encounters:  06/23/16 5' 6.25" (1.683 m)  05/08/15 5' 5.75" (1.67 m)  04/16/15 5' 6.25" (1.683 m)    General appearance: alert, cooperative and appears stated age Head: Normocephalic, without obvious abnormality, atraumatic Neck: no adenopathy, supple, symmetrical, trachea midline and thyroid normal to inspection and palpation Lungs: clear to auscultation bilaterally Breasts: normal appearance, no masses or tenderness Heart: regular rate and rhythm Abdomen: soft, non-tender; bowel sounds normal; no masses,  no organomegaly Extremities: extremities normal, atraumatic, no cyanosis or edema Skin: Skin color, texture, turgor normal. No rashes or lesions Lymph nodes: Cervical, supraclavicular, and axillary nodes normal. No abnormal inguinal nodes palpated Neurologic: Grossly normal   Pelvic: External genitalia:  no lesions              Urethra:  normal appearing urethra with no masses, tenderness or lesions              Bartholins and Skenes: normal                 Vagina: normal appearing vagina with normal color and discharge, no lesions              Cervix: no lesions              Pap taken: No. Bimanual Exam:  Uterus:  normal size, contour, position, consistency, mobility, non-tender              Adnexa: normal adnexa and no mass, fullness, tenderness               Rectovaginal: Confirms               Anus:  normal sphincter tone, no lesions  Chaperone was present for exam.  A:  Well Woman with normal exam Hypothyroidism History IBS and GERD, has undergone extensive GI evaluation Anxiety Possible right index finger fungus  P: Mammogram yearly Pap 2016 with Neg HR HPV.  No pap obtained today. CBC, Lipid, TSH with thyroid panel, Vit D, CMP Paxil 10mg  daily.  #90/4RF. Clobetasol 0.05% ointment use up to BID x 5 days prn.  #60 gm tube/0RF. Penlac topical solution.   Directions discussed.  Rx with #2RF.  Need to use for several months discussed.   Return annually or prn  ~In addition to routine gynecological examination, 15 additional minutes spent with patient, all in face to face discussion about  GI symptoms and possible other evaluation, food elimination diets, and nail fungus.

## 2016-06-24 LAB — VITAMIN D 25 HYDROXY (VIT D DEFICIENCY, FRACTURES): Vit D, 25-Hydroxy: 16 ng/mL — ABNORMAL LOW (ref 30–100)

## 2016-06-25 ENCOUNTER — Other Ambulatory Visit: Payer: Self-pay | Admitting: *Deleted

## 2016-06-25 MED ORDER — VITAMIN D (ERGOCALCIFEROL) 1.25 MG (50000 UNIT) PO CAPS: 50000.0000 [IU] | ORAL_CAPSULE | ORAL | 0 refills | Status: DC

## 2016-08-12 ENCOUNTER — Ambulatory Visit: Payer: BLUE CROSS/BLUE SHIELD | Admitting: Obstetrics & Gynecology

## 2016-09-24 ENCOUNTER — Telehealth: Payer: Self-pay | Admitting: Internal Medicine

## 2016-09-24 NOTE — Telephone Encounter (Signed)
Dr Jacinto HalimGanji called me 09/24/2016 -says patient vey dyspneic. Feels pulmonary etiology. Will be new consult been > 3 years. I can see her maybe Friday after thanksigiving post lunch before  I head to [place]  Dr. Kalman ShanMurali Adie Vilar, M.D., [facility] Pulmonary and Critical Care Medicine Staff Physician [facility] [place] [facility] Pager: [phone], If no answer or between  15:00h - 7:00h: call [phone]  09/24/2016 8:10 PM

## 2016-09-25 NOTE — Telephone Encounter (Signed)
lmtcb for pt. Will see if pt can come in on 11/24 at 1:30, will need to have schedule opened.

## 2016-09-29 NOTE — Telephone Encounter (Signed)
Called and spoke to pt. Pt states she is traveling to [place] and will not be back until January and will call us then to schedule an appt. Nothing further needed.   Will send to MR as FYI.

## 2017-09-18 ENCOUNTER — Encounter: Payer: Self-pay | Admitting: Obstetrics & Gynecology

## 2017-09-18 ENCOUNTER — Ambulatory Visit (INDEPENDENT_AMBULATORY_CARE_PROVIDER_SITE_OTHER): Payer: BLUE CROSS/BLUE SHIELD | Admitting: Obstetrics & Gynecology

## 2017-09-18 ENCOUNTER — Other Ambulatory Visit: Payer: Self-pay

## 2017-09-18 ENCOUNTER — Other Ambulatory Visit (HOSPITAL_COMMUNITY)
Admission: RE | Admit: 2017-09-18 | Discharge: 2017-09-18 | Disposition: A | Discharge status: Home / Self Care | Payer: BLUE CROSS/BLUE SHIELD | Source: Ambulatory Visit | Attending: Obstetrics & Gynecology | Admitting: Obstetrics & Gynecology

## 2017-09-18 VITALS — BP 110/66 | HR 94 | Resp 14 | Ht 65.75 in | Wt 123.0 lb

## 2017-09-18 DIAGNOSIS — Z01419 Encounter for gynecological examination (general) (routine) without abnormal findings: Secondary | ICD-10-CM | POA: Diagnosis not present

## 2017-09-18 DIAGNOSIS — Z0000 Encounter for general adult medical examination without abnormal findings: Secondary | ICD-10-CM | POA: Insufficient documentation

## 2017-09-18 DIAGNOSIS — Z23 Encounter for immunization: Secondary | ICD-10-CM | POA: Diagnosis not present

## 2017-09-18 MED ORDER — LEVOTHYROXINE SODIUM 100 MCG PO TABS: 100.0000 ug | ORAL_TABLET | Freq: Every day | ORAL | 4 refills | Status: DC

## 2017-09-18 MED ORDER — PAROXETINE HCL 10 MG PO TABS: 10.0000 mg | ORAL_TABLET | ORAL | 4 refills | Status: DC

## 2017-09-18 MED ORDER — VITAMIN D (ERGOCALCIFEROL) 1.25 MG (50000 UNIT) PO CAPS: 50000.0000 [IU] | ORAL_CAPSULE | ORAL | 4 refills | Status: DC

## 2017-09-18 NOTE — Progress Notes (Signed)
46 y.o. 864P2002 Married [place] F here for annual exam.  Doing well.  Still cycling.  Lasts 4-5 days.  Cycles about every 5 weeks.  Has been in [place] for 9 months.    Patient's last menstrual period was 08/20/2017.          Sexually active: Yes.    The current method of family planning is condoms every time.    Exercising: No.  The patient does not participate in regular exercise at present. Smoker:  no  Health Maintenance: Pap:  05/08/15 Neg   04/21/14 Neg   History of abnormal Pap:  Yes, during pregnancy.  MMG:  08/22/15 BIRADS1:neg  Colonoscopy:  Never.  D/w pt new guidelines. BMD:   Never TDaP:  2008 Screening Labs: PCP   reports that  has never smoked. she has never used smokeless tobacco. She reports that she does not drink alcohol or use drugs.  Past Medical History:  Diagnosis Date  . Acid reflux   . Chest pain   . GERD (gastroesophageal reflux disease)   . Hypothyroidism   . IBS (irritable bowel syndrome)   . Migraine   . Migraines   . Palpitations   . SOB (shortness of breath) on exertion   . Thyroid disease   . Thyroid disease    hypothyroidism    Past Surgical History:  Procedure Laterality Date  . ANAL FISSURE REPAIR     after second pregnancy  . CESAREAN SECTION     x 2    Current Outpatient Medications  Medication Sig Dispense Refill  . DEXILANT 60 MG capsule Take 1 capsule (60 mg total) by mouth daily. 30 capsule 2  . eletriptan (RELPAX) 40 MG tablet Take 1 tablet (40 mg total) by mouth as needed for migraine or headache. One tablet by mouth at onset of headache. May repeat in 2 hours if headache persists or recurs. 9 tablet 12  . Flora-Q (FLORA-Q) CAPS Take by mouth as needed.    Marland Kitchen. levothyroxine (SYNTHROID, LEVOTHROID) 100 MCG tablet Take 1 tablet (100 mcg total) by mouth daily. 90 tablet 1  . Multiple Vitamin (MULTIVITAMIN) tablet Take 1 tablet by mouth daily.    Marland Kitchen. PARoxetine (PAXIL) 10 MG tablet Take 1 tablet (10 mg total) by mouth every morning. 90  tablet 4  . valACYclovir (VALTREX) 1000 MG tablet Take as directed 30 tablet 0   No current facility-administered medications for this visit.     Family History  Problem Relation Age of Onset  . Diabetes Father   . Hypertension Father     ROS:  Pertinent items are noted in HPI.  Otherwise, a comprehensive ROS was negative.  Exam:   BP 110/66 (BP Location: Right Arm, Patient Position: Sitting, Cuff Size: Normal)   Pulse 94   Resp 14   Ht 5' 5.75" (1.67 m)   Wt 123 lb (55.8 kg)   LMP 08/20/2017   BMI 20.00 kg/m   Weight change: +3#   Height: 5' 5.75" (167 cm)  Ht Readings from Last 3 Encounters:  09/18/17 5' 5.75" (1.67 m)  06/23/16 5' 6.25" (1.683 m)  05/08/15 5' 5.75" (1.67 m)    General appearance: alert, cooperative and appears stated age Head: Normocephalic, without obvious abnormality, atraumatic Neck: no adenopathy, supple, symmetrical, trachea midline and thyroid normal to inspection and palpation Lungs: clear to auscultation bilaterally Breasts: normal appearance, no masses or tenderness Heart: regular rate and rhythm Abdomen: soft, non-tender; bowel sounds normal; no masses,  no  organomegaly Extremities: extremities normal, atraumatic, no cyanosis or edema Skin: Skin color, texture, turgor normal. No rashes or lesions Lymph nodes: Cervical, supraclavicular, and axillary nodes normal. No abnormal inguinal nodes palpated Neurologic: Grossly normal   Pelvic: External genitalia:  no lesions              Urethra:  normal appearing urethra with no masses, tenderness or lesions              Bartholins and Skenes: normal                 Vagina: normal appearing vagina with normal color and discharge, no lesions              Cervix: no lesions              Pap taken: Yes.   Bimanual Exam:  Uterus:  normal size, contour, position, consistency, mobility, non-tender              Adnexa: normal adnexa and no mass, fullness, tenderness               Rectovaginal:  Confirms               Anus:  normal sphincter tone, no lesions  Chaperone was present for exam.  A:  Well Woman with normal exam Hypothyroidism H/o IBS and GERD Anxiety  P:   Mammogram guidelines reviewed.  Pt knows this is due pap smear and HR HPV obtained today Paxil 10mg  daily.  #90/4RF Synthroid 100mcg daily.  #90/4Rf Vit D 50K weekly.  #12/4RF Tdap updated today TSH, Vit D, Lipids return annually or prn

## 2017-09-19 LAB — LIPID PANEL
CHOL/HDL RATIO: 4.2 ratio (ref 0.0–4.4)
Cholesterol, Total: 166 mg/dL (ref 100–199)
HDL: 40 mg/dL (ref >39)
LDL CALC: 67 mg/dL (ref 0–99)
Triglycerides: 293 mg/dL — ABNORMAL HIGH (ref 0–149)
VLDL Cholesterol Cal: 59 mg/dL — ABNORMAL HIGH (ref 5–40)

## 2017-09-19 LAB — VITAMIN D 25 HYDROXY (VIT D DEFICIENCY, FRACTURES): Vit D, 25-Hydroxy: 25.8 ng/mL — ABNORMAL LOW (ref 30.0–100.0)

## 2017-09-19 LAB — TSH: TSH: 3.69 u[IU]/mL (ref 0.450–4.500)

## 2017-09-22 LAB — CYTOLOGY - PAP
Diagnosis: NEGATIVE
HPV (WINDOPATH): NOT DETECTED

## 2018-02-09 ENCOUNTER — Other Ambulatory Visit: Payer: Self-pay

## 2018-02-09 NOTE — Telephone Encounter (Signed)
Received faxed refill from [facility] for the following:  Medication refill request: Dexilant 60mg  #15 Last AEX:  09-18-17 Next AEX:  12-10-18 Last MMG (if hormonal medication request): 08-22-15 Neg/BiRads1 Refill authorized: Please advise

## 2018-02-12 MED ORDER — DEXILANT 60 MG PO CPDR: 60.0000 mg | DELAYED_RELEASE_CAPSULE | Freq: Every day | ORAL | 0 refills | Status: DC

## 2018-02-12 NOTE — Telephone Encounter (Signed)
I did the RF as requested for #30/0RF, DAW.  I think she normally gets this from Dr. Loreta AveMann.  I've done a 2 month RF in the past--about two years ago.  Can you let her know this is done but it is best to let Dr. Loreta AveMann keep refilling this Rx.  Thanks.

## 2018-02-17 NOTE — Telephone Encounter (Signed)
Called patient left message to return my call.

## 2018-02-22 NOTE — Telephone Encounter (Signed)
Called patient left message to return my call.

## 2018-03-24 ENCOUNTER — Other Ambulatory Visit: Payer: Self-pay | Admitting: Family Medicine

## 2018-03-24 DIAGNOSIS — Z1231 Encounter for screening mammogram for malignant neoplasm of breast: Secondary | ICD-10-CM

## 2018-04-13 ENCOUNTER — Ambulatory Visit: Payer: BLUE CROSS/BLUE SHIELD

## 2018-12-10 ENCOUNTER — Ambulatory Visit: Payer: BLUE CROSS/BLUE SHIELD | Admitting: Obstetrics & Gynecology

## 2019-02-23 ENCOUNTER — Other Ambulatory Visit: Payer: Self-pay | Admitting: Obstetrics & Gynecology

## 2019-02-23 DIAGNOSIS — Z0000 Encounter for general adult medical examination without abnormal findings: Secondary | ICD-10-CM

## 2019-02-23 MED ORDER — PAROXETINE HCL 10 MG PO TABS: 10.0000 mg | ORAL_TABLET | ORAL | 0 refills | Status: DC

## 2019-02-23 NOTE — Telephone Encounter (Signed)
Medication refill request: paxil  Last AEX:  09/18/17 SM Next AEX: none Last MMG (if hormonal medication request): 2016 Refill authorized: 11/18/16 #[id number]/4R. Please advise.

## 2019-02-23 NOTE — Telephone Encounter (Signed)
Patient needs medication refill on paroxetine. [facility].

## 2019-03-07 ENCOUNTER — Ambulatory Visit: Payer: Self-pay | Admitting: Obstetrics & Gynecology

## 2019-05-25 ENCOUNTER — Other Ambulatory Visit: Payer: Self-pay

## 2019-05-27 ENCOUNTER — Ambulatory Visit: Payer: BC Managed Care – PPO | Admitting: Obstetrics & Gynecology

## 2019-05-27 ENCOUNTER — Other Ambulatory Visit: Payer: Self-pay

## 2019-05-27 ENCOUNTER — Encounter: Payer: Self-pay | Admitting: Obstetrics & Gynecology

## 2019-05-27 VITALS — BP 102/70 | HR 72 | Temp 97.3°F | Ht 66.0 in | Wt 127.0 lb

## 2019-05-27 DIAGNOSIS — E031 Congenital hypothyroidism without goiter: Secondary | ICD-10-CM

## 2019-05-27 DIAGNOSIS — Z01419 Encounter for gynecological examination (general) (routine) without abnormal findings: Secondary | ICD-10-CM

## 2019-05-27 DIAGNOSIS — R748 Abnormal levels of other serum enzymes: Secondary | ICD-10-CM

## 2019-05-27 DIAGNOSIS — Z0000 Encounter for general adult medical examination without abnormal findings: Secondary | ICD-10-CM

## 2019-05-27 MED ORDER — ELETRIPTAN HYDROBROMIDE 40 MG PO TABS: 40.0000 mg | ORAL_TABLET | ORAL | 4 refills | Status: DC | PRN

## 2019-05-27 MED ORDER — ELETRIPTAN HYDROBROMIDE 40 MG PO TABS: ORAL_TABLET | ORAL | 4 refills | Status: AC

## 2019-05-27 MED ORDER — PAROXETINE HCL 10 MG PO TABS: 10.0000 mg | ORAL_TABLET | ORAL | 4 refills | Status: AC

## 2019-05-27 MED ORDER — LEVOTHYROXINE SODIUM 100 MCG PO TABS: 100.0000 ug | ORAL_TABLET | Freq: Every day | ORAL | 4 refills | Status: AC

## 2019-05-27 MED ORDER — DEXILANT 60 MG PO CPDR: 60.0000 mg | DELAYED_RELEASE_CAPSULE | Freq: Every day | ORAL | 4 refills | Status: AC

## 2019-05-27 NOTE — Addendum Note (Signed)
Addended by: Megan Salon on: 05/27/2019 12:19 PM   Modules accepted: Orders

## 2019-05-27 NOTE — Patient Instructions (Signed)
Dr. Jamse [place] [facility] [address] [place], [place] [address] Phone: [phone]

## 2019-05-27 NOTE — Progress Notes (Addendum)
48 y.o. G67P2002 Married Chloe Santos here for annual exam.  Doing well.  Cycles have started to stretch out a little.  Flow lasts for four days but reports flow has not really changed.    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  Doesn't use more than two to three Relpax each month.   Patient's last menstrual period was 04/29/2019 (exact date).          Sexually active: Yes.    The current method of family planning is condoms evey time.    Exercising: No.   Smoker:  no  Health Maintenance: Pap:  09/18/17 Neg. HR HPV:neg   05/08/15 Neg  History of abnormal Pap:  yes MMG:  08/22/15 BIRADS1:Neg.  She is clearly aware that she needs to have this done.   Colonoscopy:  Never. Has appt with Dr. Collene Mares. BMD:   Never TDaP:  2018 Pneumonia vaccine(s):  n/a Shingrix:   No Hep C testing: n/a Screening Labs: Here - fasting    reports that she has never smoked. She has never used smokeless tobacco. She reports that she does not drink alcohol or use drugs.  Past Medical History:  Diagnosis Date  . Acid reflux   . Chest pain   . GERD (gastroesophageal reflux disease)   . Hypothyroidism   . IBS (irritable bowel syndrome)   . Migraine   . Migraines   . Palpitations   . SOB (shortness of breath) on exertion   . Thyroid disease   . Thyroid disease    hypothyroidism    Past Surgical History:  Procedure Laterality Date  . ANAL FISSURE REPAIR     after second pregnancy  . CESAREAN SECTION     x 2    Current Outpatient Medications  Medication Sig Dispense Refill  . cholecalciferol (VITAMIN D3) 25 MCG (1000 UT) tablet Take 1,000 Units by mouth daily.    Marland Kitchen DEXILANT 60 MG capsule Take 1 capsule (60 mg total) by mouth daily. 30 capsule 0  . eletriptan (RELPAX) 40 MG tablet Take 1 tablet (40 mg total) by mouth as needed for migraine or headache. One tablet by mouth at onset of headache. May repeat in 2  hours if headache persists or recurs. 9 tablet 12  . Flora-Q (FLORA-Q) CAPS Take by mouth as needed.    Marland Kitchen levothyroxine (SYNTHROID, LEVOTHROID) 100 MCG tablet Take 1 tablet (100 mcg total) daily by mouth. 90 tablet 4  . Multiple Vitamin (MULTIVITAMIN) tablet Take 1 tablet by mouth daily.    Marland Kitchen PARoxetine (PAXIL) 10 MG tablet Take 1 tablet (10 mg total) by mouth every morning. 30 tablet 0  . valACYclovir (VALTREX) 1000 MG tablet Take as directed 30 tablet 0  . vitamin A 10000 UT capsule Take 10,000 Units by mouth daily.    . vitamin B-12 (CYANOCOBALAMIN) 500 MCG tablet Take 500 mcg by mouth daily.    . vitamin C (ASCORBIC ACID) 500 MG tablet Take 500 mg by mouth daily.    . Zinc Sulfate (ZINC 15 PO) Take by mouth.     No current facility-administered medications for this visit.     Family History  Problem Relation Age of Onset  . Diabetes Father   . Hypertension Father     Review of Systems  All other systems reviewed and are negative.   Exam:   BP 102/70  Pulse 72   Temp (!) 97.3 F (36.3 C) (Temporal)   Ht 5\' 6"  (1.676 m)   Wt 127 lb (57.6 kg)   LMP 04/29/2019 (Exact Date)   BMI 20.50 kg/m    Height: 5\' 6"  (167.6 cm)  Ht Readings from Last 3 Encounters:  05/27/19 5\' 6"  (1.676 m)  09/18/17 5' 5.75" (1.67 m)  06/23/16 5' 6.25" (1.683 m)    General appearance: alert, cooperative and appears stated age Head: Normocephalic, without obvious abnormality, atraumatic Neck: no adenopathy, supple, symmetrical, trachea midline and thyroid normal to inspection and palpation Lungs: clear to auscultation bilaterally Breasts: normal appearance, no masses or tenderness Heart: regular rate and rhythm Abdomen: soft, non-tender; bowel sounds normal; no masses,  no organomegaly Extremities: extremities normal, atraumatic, no cyanosis or edema Skin: Skin color, texture, turgor normal. No rashes or lesions Lymph nodes: Cervical, supraclavicular, and axillary nodes normal. No abnormal  inguinal nodes palpated Neurologic: Grossly normal   Pelvic: External genitalia:  no lesions              Urethra:  normal appearing urethra with no masses, tenderness or lesions              Bartholins and Skenes: normal                 Vagina: normal appearing vagina with normal color and discharge, no lesions              Cervix: no lesions              Pap taken: No. Bimanual Exam:  Uterus:  normal size, contour, position, consistency, mobility, non-tender              Adnexa: normal adnexa and no mass, fullness, tenderness               Rectovaginal: Confirms               Anus:  normal sphincter tone, no lesions  Chaperone was present for exam.  A:  Well Woman with normal exam Perimenopausal changes Hypothyroidism H/O IBS H/O GERD Anxiety  P:   Mammogram guidelines reviewed.  Clearly aware this is due. pap smear with eng HR HPV 11/18.  Not indicated today RX for paxil 10mg  daily.  #90/4RF RF for Synthroid 100mcg daily.  #90/4RF TSH and free T4, Vit D, Lipids, CBC, CMP Dexilant rx to pharmacy Colonoscopy is scheduled for November return annually or prn

## 2019-05-28 LAB — COMPREHENSIVE METABOLIC PANEL
ALT: 49 IU/L — ABNORMAL HIGH (ref 0–32)
AST: 36 IU/L (ref 0–40)
Albumin/Globulin Ratio: 1.7 (ref 1.2–2.2)
Albumin: 4.3 g/dL (ref 3.8–4.8)
Alkaline Phosphatase: 90 IU/L (ref 39–117)
BUN/Creatinine Ratio: 10 (ref 9–23)
BUN: 7 mg/dL (ref 6–24)
Bilirubin Total: 0.7 mg/dL (ref 0.0–1.2)
CO2: 25 mmol/L (ref 20–29)
Calcium: 9.2 mg/dL (ref 8.7–10.2)
Chloride: 101 mmol/L (ref 96–106)
Creatinine, Ser: 0.73 mg/dL (ref 0.57–1.00)
GFR calc Af Amer: 111 mL/min/{1.73_m2} (ref >59)
GFR calc non Af Amer: 96 mL/min/{1.73_m2} (ref >59)
Globulin, Total: 2.6 g/dL (ref 1.5–4.5)
Glucose: 78 mg/dL (ref 65–99)
Potassium: 4.5 mmol/L (ref 3.5–5.2)
Sodium: 141 mmol/L (ref 134–144)
Total Protein: 6.9 g/dL (ref 6.0–8.5)

## 2019-05-28 LAB — CBC
Hematocrit: 42.6 % (ref 34.0–46.6)
Hemoglobin: 13.9 g/dL (ref 11.1–15.9)
MCH: 31 pg (ref 26.6–33.0)
MCHC: 32.6 g/dL (ref 31.5–35.7)
MCV: 95 fL (ref 79–97)
Platelets: 259 10*3/uL (ref 150–450)
RBC: 4.49 x10E6/uL (ref 3.77–5.28)
RDW: 12.2 % (ref 11.7–15.4)
WBC: 6.8 10*3/uL (ref 3.4–10.8)

## 2019-05-28 LAB — LIPID PANEL
Chol/HDL Ratio: 3.8 ratio (ref 0.0–4.4)
Cholesterol, Total: 151 mg/dL (ref 100–199)
HDL: 40 mg/dL (ref >39)
LDL Calculated: 87 mg/dL (ref 0–99)
Triglycerides: 120 mg/dL (ref 0–149)
VLDL Cholesterol Cal: 24 mg/dL (ref 5–40)

## 2019-05-28 LAB — T4, FREE: Free T4: 1.44 ng/dL (ref 0.82–1.77)

## 2019-05-28 LAB — TSH: TSH: 3.82 u[IU]/mL (ref 0.450–4.500)

## 2019-05-28 LAB — VITAMIN D 25 HYDROXY (VIT D DEFICIENCY, FRACTURES): Vit D, 25-Hydroxy: 36.4 ng/mL (ref 30.0–100.0)

## 2019-05-31 NOTE — Addendum Note (Signed)
Addended by: Megan Salon on: 05/31/2019 10:51 AM   Modules accepted: Orders

## 2019-06-28 ENCOUNTER — Other Ambulatory Visit: Payer: Self-pay

## 2019-06-28 ENCOUNTER — Telehealth: Payer: Self-pay | Admitting: Obstetrics & Gynecology

## 2019-06-28 NOTE — Telephone Encounter (Signed)
Patient states she can pick this up at her appointment on 06/30/19.

## 2019-06-28 NOTE — Telephone Encounter (Signed)
Call to patient. Patient states that she just wanted to know what it was that was elevated that she's coming in to have recheck. Result note from 05-27-2019 reviewed with patient and she verbalized understanding. Patient states she is going to check with her PCP's office to see if they have done any blood work for her in the past and have them fax to our office.  Patient scheduled for follow up blood work on 06-30-2019.  Will close encounter.

## 2019-06-28 NOTE — Telephone Encounter (Signed)
Patient would like a printout of the results from her last appointment on 05/27/19. She is active on mychart, but states she is horrible at accessing it.

## 2019-06-29 ENCOUNTER — Other Ambulatory Visit: Payer: BC Managed Care – PPO

## 2019-06-30 ENCOUNTER — Other Ambulatory Visit (INDEPENDENT_AMBULATORY_CARE_PROVIDER_SITE_OTHER): Payer: BC Managed Care – PPO

## 2019-06-30 ENCOUNTER — Other Ambulatory Visit: Payer: Self-pay

## 2019-06-30 DIAGNOSIS — R748 Abnormal levels of other serum enzymes: Secondary | ICD-10-CM

## 2019-07-01 LAB — HEPATIC FUNCTION PANEL
ALT: 73 IU/L — ABNORMAL HIGH (ref 0–32)
AST: 38 IU/L (ref 0–40)
Albumin: 4.7 g/dL (ref 3.8–4.8)
Alkaline Phosphatase: 103 IU/L (ref 39–117)
Bilirubin Total: 0.7 mg/dL (ref 0.0–1.2)
Bilirubin, Direct: 0.15 mg/dL (ref 0.00–0.40)
Total Protein: 7 g/dL (ref 6.0–8.5)

## 2019-11-10 ENCOUNTER — Encounter

## 2020-01-04 ENCOUNTER — Telehealth: Payer: Self-pay | Admitting: *Deleted

## 2020-01-04 NOTE — Telephone Encounter (Signed)
Incoming fax request received from [facility] stating," may we change from Sandoz Levothyroxine to Alvogen Levothyroxine? Sandoz no longer available. Thanks."   Routing to provider to review and advise on prescription.

## 2020-01-18 NOTE — Telephone Encounter (Signed)
Encounter closed per Dr. Miller.  

## 2020-01-18 NOTE — Telephone Encounter (Signed)
Rx denied and let message for pt to call if needs refill or change.  Ok to close encounter.

## 2020-08-06 NOTE — Progress Notes (Deleted)
49 y.o. G55P2002 Married [place] female here for annual exam.    No LMP recorded.          Sexually active: {yes no:314532}  The current method of family planning is {contraception:315051}.    Exercising: {yes no:314532}  {types:19826} Smoker:  {YES NO:22349}  Health Maintenance: Pap:  09-18-17 neg HPV HR neg History of abnormal Pap:  yes MMG:  08-22-15 birads 1:neg Colonoscopy: 09-21-2019 f/u 61yrs BMD:   none TDaP:  2018 Pneumonia vaccine(s):  no Shingrix:   no Hep C testing: no Screening Labs: ***   reports that she has never smoked. She has never used smokeless tobacco. She reports that she does not drink alcohol and does not use drugs.  Past Medical History:  Diagnosis Date  . Acid reflux   . Chest pain   . GERD (gastroesophageal reflux disease)   . Hypothyroidism   . IBS (irritable bowel syndrome)   . Migraine   . Migraines   . Palpitations   . SOB (shortness of breath) on exertion   . Thyroid disease   . Thyroid disease    hypothyroidism    Past Surgical History:  Procedure Laterality Date  . ANAL FISSURE REPAIR     after second pregnancy  . CESAREAN SECTION     x 2    Current Outpatient Medications  Medication Sig Dispense Refill  . cholecalciferol (VITAMIN D3) 25 MCG (1000 UT) tablet Take 1,000 Units by mouth daily.    Marland Kitchen DEXILANT 60 MG capsule Take 1 capsule (60 mg total) by mouth daily. 90 capsule 4  . eletriptan (RELPAX) 40 MG tablet May repeat in 2 hours.  Max dosage in 80mg /24 hrs. 21 tablet 4  . Flora-Q (FLORA-Q) CAPS Take by mouth as needed.    04[birth year]1984 levothyroxine (SYNTHROID) 100 MCG tablet Take 1 tablet (100 mcg total) by mouth daily. 90 tablet 4  . Multiple Vitamin (MULTIVITAMIN) tablet Take 1 tablet by mouth daily.    Marland Kitchen PARoxetine (PAXIL) 10 MG tablet Take 1 tablet (10 mg total) by mouth every morning. 90 tablet 4  . valACYclovir (VALTREX) 1000 MG tablet Take as directed 30 tablet 0  . vitamin A Marland Kitchen UT capsule Take 10,000 Units by mouth daily.    .  vitamin B-12 (CYANOCOBALAMIN) 500 MCG tablet Take 500 mcg by mouth daily.    . vitamin C (ASCORBIC ACID) 500 MG tablet Take 500 mg by mouth daily.    . Zinc Sulfate (ZINC 15 PO) Take by mouth.     No current facility-administered medications for this visit.    Family History  Problem Relation Age of Onset  . Diabetes Father   . Hypertension Father     Review of Systems  Exam:   There were no vitals taken for this visit.     General appearance: alert, cooperative and appears stated age Head: Normocephalic, without obvious abnormality, atraumatic Neck: no adenopathy, supple, symmetrical, trachea midline and thyroid {EXAM; THYROID:18604} Lungs: clear to auscultation bilaterally Breasts: {Exam; breast:13139::"normal appearance, no masses or tenderness"} Heart: regular rate and rhythm Abdomen: soft, non-tender; bowel sounds normal; no masses,  no organomegaly Extremities: extremities normal, atraumatic, no cyanosis or edema Skin: Skin color, texture, turgor normal. No rashes or lesions Lymph nodes: Cervical, supraclavicular, and axillary nodes normal. No abnormal inguinal nodes palpated Neurologic: Grossly normal   Pelvic: External genitalia:  no lesions              Urethra:  normal appearing urethra with no  masses, tenderness or lesions              Bartholins and Skenes: normal                 Vagina: normal appearing vagina with normal color and discharge, no lesions              Cervix: {exam; cervix:14595}              Pap taken: {yes no:314532} Bimanual Exam:  Uterus:  {exam; uterus:12215}              Adnexa: {exam; adnexa:12223}               Rectovaginal: Confirms               Anus:  normal sphincter tone, no lesions  Chaperone, ***Zenovia Jordan, CMA, was present for exam.  A:  Well Woman with normal exam  P:   {plan; gyn:5269::"mammogram","pap smear","return annually or prn"}

## 2020-08-07 ENCOUNTER — Ambulatory Visit: Payer: BC Managed Care – PPO | Admitting: Obstetrics & Gynecology

## 2020-08-07 ENCOUNTER — Encounter: Payer: Self-pay | Admitting: Obstetrics & Gynecology

## 2022-02-22 ENCOUNTER — Emergency Department (HOSPITAL_BASED_OUTPATIENT_CLINIC_OR_DEPARTMENT_OTHER): Payer: PRIVATE HEALTH INSURANCE

## 2022-02-22 ENCOUNTER — Emergency Department (HOSPITAL_BASED_OUTPATIENT_CLINIC_OR_DEPARTMENT_OTHER)
Admission: EM | Admit: 2022-02-22 | Discharge: 2022-02-22 | Disposition: A | Discharge status: Home / Self Care | Payer: PRIVATE HEALTH INSURANCE | Attending: Student | Admitting: Student

## 2022-02-22 ENCOUNTER — Other Ambulatory Visit: Payer: Self-pay

## 2022-02-22 ENCOUNTER — Encounter (HOSPITAL_BASED_OUTPATIENT_CLINIC_OR_DEPARTMENT_OTHER): Payer: Self-pay | Admitting: *Deleted

## 2022-02-22 DIAGNOSIS — M533 Sacrococcygeal disorders, not elsewhere classified: Secondary | ICD-10-CM | POA: Insufficient documentation

## 2022-02-22 DIAGNOSIS — E039 Hypothyroidism, unspecified: Secondary | ICD-10-CM | POA: Insufficient documentation

## 2022-02-22 DIAGNOSIS — R519 Headache, unspecified: Secondary | ICD-10-CM | POA: Insufficient documentation

## 2022-02-22 DIAGNOSIS — M542 Cervicalgia: Secondary | ICD-10-CM | POA: Diagnosis not present

## 2022-02-22 DIAGNOSIS — R55 Syncope and collapse: Secondary | ICD-10-CM | POA: Diagnosis present

## 2022-02-22 MED ORDER — LACTATED RINGERS IV BOLUS
1000.0000 mL | Freq: Once | INTRAVENOUS | Status: AC
  Administered 2022-02-22: 1000 mL via INTRAVENOUS

## 2022-02-22 MED ORDER — DIPHENHYDRAMINE HCL 50 MG/ML IJ SOLN
12.5000 mg | Freq: Once | INTRAMUSCULAR | Status: AC
  Administered 2022-02-22: 12.5 mg via INTRAVENOUS
  Filled 2022-02-22: qty 1

## 2022-02-22 MED ORDER — PROCHLORPERAZINE EDISYLATE 10 MG/2ML IJ SOLN
10.0000 mg | Freq: Once | INTRAMUSCULAR | Status: AC
  Administered 2022-02-22: 10 mg via INTRAVENOUS
  Filled 2022-02-22: qty 2

## 2022-02-22 NOTE — ED Provider Notes (Signed)
?[facility] EMER[facility]Y DEPT ?Provider Note ? ?CSN: [id number] ?Arrival date & time: 02/22/22 1901 ? ?Chief Complaint(s) ?Loss of Consciousness, Back Pain, Neck Pain, and Head Injury ? ?HPI ?Chloe Santos is a 51 y.o. female with PMH hypothyroidism, migraines, chronic low blood pressure who presents emergency department for evaluation of syncope and resultant headache, neck pain and tailbone pain.  Patient states that she took Benadryl last night and woke up this morning very groggy.  She states that she was on the couch for a long period of time and then quickly stood up and woke up on the ground.  She awoke with a headache, left-sided paraspinal neck pain and tailbone pain.  She states that she believes that she lost consciousness for approximately 10 seconds but returned to normal mental status baseline quickly.  No associated tongue biting or involuntary urination. ? ? ?Loss of Consciousness ?Associated symptoms: headaches   ?Back Pain ?Associated symptoms: headaches   ?Neck Pain ?Associated symptoms: headaches and syncope   ?Head Injury ?Associated symptoms: headache and neck pain   ? ?Past Medical History ?Past Medical History:  ?Diagnosis Date  ? Acid reflux   ? Chest pain   ? GERD (gastroesophageal reflux disease)   ? Hypothyroidism   ? IBS (irritable bowel syndrome)   ? Migraine   ? Migraines   ? Palpitations   ? SOB (shortness of breath) on exertion   ? Thyroid disease   ? Thyroid disease   ? hypothyroidism  ? ?Patient Active Problem List  ? Diagnosis Date Noted  ? Shortness of breath 06/02/2013  ? GERD (gastroesophageal reflux disease) 06/02/2013  ? Bloating 02/01/2013  ? Pigmented skin lesion 02/01/2013  ? ?Home Medication(s) ?Prior to Admission medications   ?Medication Sig Start Date End Date Taking? Authorizing Provider  ?pantoprazole (PROTONIX) 20 MG tablet Take 20 mg by mouth daily.   Yes [provider]  ?cholecalciferol (VITAMIN D3) 25 MCG (1000 UT) tablet Take 1,000 Units by  mouth daily.    [provider]  ?DEXILANT 60 MG capsule Take 1 capsule (60 mg total) by mouth daily. 05/27/19   Jerene BearsMiller, Mary S, MD  ?eletriptan (RELPAX) 40 MG tablet May repeat in 2 hours.  Max dosage in 80mg /24 hrs. 05/27/19   Jerene BearsMiller, Mary S, MD  ?[facility](FLORA[facility]Q) CAPS Take by mouth as needed.    [provider]  ?levothyroxine (SYNTHROID) 100 MCG tablet Take 1 tablet (100 mcg total) by mouth daily. 05/27/19   Jerene BearsMiller, Mary S, MD  ?Multiple Vitamin (MULTIVITAMIN) tablet Take 1 tablet by mouth daily.    [provider]  ?PARoxetine (PAXIL) 10 MG tablet Take 1 tablet (10 mg total) by mouth every morning. 05/27/19   Jerene BearsMiller, Mary S, MD  ?valACYclovir Ralph Dowdy(VALTREX) 1000 MG tablet Take as directed 01/17/16   Jerene BearsMiller, Mary S, MD  ?vitamin A [id number] UT capsule Take 10,000 Units by mouth daily.    [provider]  ?vitamin B-12 (CYANOCOBALAMIN) 500 MCG tablet Take 500 mcg by mouth daily.    [provider]  ?vitamin C (ASCORBIC ACID) 500 MG tablet Take 500 mg by mouth daily.    [provider]  ?Zinc Sulfate (ZINC 15 PO) Take by mouth.    [provider]  ?                                                                                                                                  ?  Past Surgical History ?Past Surgical History:  ?Procedure Laterality Date  ? ANAL FISSURE REPAIR    ? after second pregnancy  ? CESAREAN SECTION    ? x 2  ? ?Family History ?Family History  ?Problem Relation Age of Onset  ? Diabetes Father   ? Hypertension Father   ? ? ?Social History ?Social History  ? ?Tobacco Use  ? Smoking status: Never  ? Smokeless tobacco: Never  ?Vaping Use  ? Vaping Use: Never used  ?Substance Use Topics  ? Alcohol use: No  ? Drug use: No  ? ?Allergies ?Hyoscyamine sulfate ? ?Review of Systems ?Review of Systems  ?Cardiovascular:  Positive for syncope.  ?Musculoskeletal:  Positive for back pain and neck pain.  ?Neurological:  Positive for headaches.  ? ?Physical  Exam ?Vital Signs  ?I have reviewed the triage vital signs ?BP 126/85   Pulse (!) 54   Temp 97.9 ?F (36.6 ?C) (Oral)   Resp 16   SpO2 100%  ? ?Physical Exam ?Vitals and nursing note reviewed.  ?Constitutional:   ?   General: She is not in acute distress. ?   Appearance: She is well-developed.  ?HENT:  ?   Head: Normocephalic and atraumatic.  ?Eyes:  ?   Conjunctiva/sclera: Conjunctivae normal.  ?Cardiovascular:  ?   Rate and Rhythm: Normal rate and regular rhythm.  ?   Heart sounds: No murmur heard. ?Pulmonary:  ?   Effort: Pulmonary effort is normal. No respiratory distress.  ?   Breath sounds: Normal breath sounds.  ?Abdominal:  ?   Palpations: Abdomen is soft.  ?   Tenderness: There is no abdominal tenderness.  ?Musculoskeletal:     ?   General: Tenderness present. No swelling.  ?   Cervical back: Tenderness present.  ?Skin: ?   General: Skin is warm and dry.  ?   Capillary Refill: Capillary refill takes less than 2 seconds.  ?Neurological:  ?   Mental Status: She is alert.  ?Psychiatric:     ?   Mood and Affect: Mood normal.  ? ? ?ED Results and Treatments ?Labs ?(all labs ordered are listed, but only abnormal results are displayed) ?Labs Reviewed - No data to display                                                                                                                       ? ?Radiology ?No results found. ? ?Pertinent labs & imaging results that were available during my care of the patient were reviewed by me and considered in my medical decision making (see MDM for details). ? ?Medications Ordered in ED ?Medications  ?lactated ringers bolus 1,000 mL (1,000 mLs Intravenous New Bag/Given 02/22/22 2051)  ?prochlorperazine (COMPAZINE) injection 10 mg (10 mg Intravenous Given 02/22/22 2052)  ?diphenhydrAMINE (BENADRYL) injection 12.5 mg (12.5 mg Intravenous Given 02/22/22 2053)  ?                                                               ?                                                                     ?  Procedures ?Procedures ? ?(including critical care time) ? ?Medical Decision Making / ED Course ? ? ?This patient presents to the ED for concern of syncope, headache, back pain, this involves an extensive number of treatment options, and is a complaint that carries with it a high risk of complications and morbidity.  The differential diagnosis includes orthostatic syncope, vasovagal syncope, dehydration, migraine, fracture ? ?MDM: ?Patient seen emergency department for evaluation multiple complaints as described above.  Physical exam is with left-sided paraspinal cervical tenderness, sacral tenderness to palpation.  Neurologic exam unremarkable with no focal motor or sensory deficits.  Patient given a headache cocktail and on reevaluation symptoms have significant improved.  CT head, C-spine, L-spine unremarkable.  Patient does have an empty sella on CT head and given patient's history of migraines, and outpatient neurologic work-up of possible pseudotumor may be warranted.  I will leave this to neurologic providers.  Patient safe for discharge at this time and she was discharged with outpatient follow-up.  Suspect orthostatic syncope today. ? ? ?Additional history obtained: ?-Additional history obtained from husband ?-External records from outside source obtained and reviewed including: Chart review including previous notes, labs, imaging, consultation notes ? ? ?Lab Tests: ?-I ordered, reviewed, and interpreted labs.   ?The pertinent results include:   ?Labs Reviewed - No data to display  ? ? ? ? ?Imaging Studies ordered: ?I ordered imaging studies including CT head, CT L-spine, CT C-spine ?I independently visualized and interpreted imaging. ?I agree with the radiologist interpretation ? ? ?Medicines ordered and prescription drug management: ?Meds ordered this encounter  ?Medications  ? lactated ringers bolus 1,000 mL  ? prochlorperazine (COMPAZINE) injection 10 mg  ? diphenhydrAMINE (BENADRYL) injection 12.5  mg  ?  ?-I have reviewed the patients home medicines and have made adjustments as needed ? ?Critical interventions ?None ? ? ?Cardiac Monitoring: ?The patient was maintained on a cardiac monitor.  I personall

## 2022-02-22 NOTE — ED Notes (Signed)
Patient ambulated to restroom independently.

## 2022-02-22 NOTE — ED Triage Notes (Signed)
Patient states that around 1130a she was on the phone with a friend when she loss consciousness. States that she has head pain, neck pain, and lower back pain. States now head ache.  ? ?Is alert and oriented. 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 Took nyquil last night and was very groggy when she woke up this am.  ?
